# Patient Record
Sex: Male | Born: 1980 | Race: Black or African American | Hispanic: No | Marital: Single | Smoking: Former smoker
Health system: Southern US, Community
[De-identification: ages and names within clinical notes are randomized; demographics above are authoritative.]

## PROBLEM LIST (undated history)

## (undated) DIAGNOSIS — J45909 Unspecified asthma, uncomplicated: Secondary | ICD-10-CM

## (undated) HISTORY — PX: NO PAST SURGERIES: SHX2092

## (undated) HISTORY — PX: FOOT SURGERY: SHX648

---

## 2012-08-27 ENCOUNTER — Encounter (HOSPITAL_COMMUNITY): Payer: Self-pay | Admitting: *Deleted

## 2012-08-27 ENCOUNTER — Emergency Department (HOSPITAL_COMMUNITY)
Admission: EM | Admit: 2012-08-27 | Discharge: 2012-08-28 | Disposition: A | Discharge status: Home / Self Care | Payer: Self-pay | Attending: Emergency Medicine | Admitting: Emergency Medicine

## 2012-08-27 DIAGNOSIS — J45909 Unspecified asthma, uncomplicated: Secondary | ICD-10-CM | POA: Insufficient documentation

## 2012-08-27 DIAGNOSIS — H9209 Otalgia, unspecified ear: Secondary | ICD-10-CM | POA: Insufficient documentation

## 2012-08-27 DIAGNOSIS — F17200 Nicotine dependence, unspecified, uncomplicated: Secondary | ICD-10-CM | POA: Insufficient documentation

## 2012-08-27 DIAGNOSIS — H6120 Impacted cerumen, unspecified ear: Secondary | ICD-10-CM | POA: Insufficient documentation

## 2012-08-27 DIAGNOSIS — H9190 Unspecified hearing loss, unspecified ear: Secondary | ICD-10-CM | POA: Insufficient documentation

## 2012-08-27 DIAGNOSIS — H6093 Unspecified otitis externa, bilateral: Secondary | ICD-10-CM

## 2012-08-27 DIAGNOSIS — H6123 Impacted cerumen, bilateral: Secondary | ICD-10-CM

## 2012-08-27 DIAGNOSIS — H60399 Other infective otitis externa, unspecified ear: Secondary | ICD-10-CM | POA: Insufficient documentation

## 2012-08-27 HISTORY — DX: Unspecified asthma, uncomplicated: J45.909

## 2012-08-27 NOTE — ED Notes (Signed)
Left ear "feels clogged" has been sticking objects in ear to try and remove sensation.

## 2012-08-28 MED ORDER — DOCUSATE SODIUM 50 MG/5ML PO LIQD
50.0000 mg | Freq: Once | ORAL | Status: AC
  Administered 2012-08-28: 50 mg via OTIC
  Filled 2012-08-28: qty 10

## 2012-08-28 MED ORDER — OFLOXACIN 0.3 % OT SOLN: 3.0000 [drp] | Freq: Two times a day (BID) | OTIC | Status: AC

## 2012-08-28 NOTE — ED Provider Notes (Signed)
History    This chart was scribed for a non-physician practitioner working with Gerhard Munch, MD by Jiles Prows, ED scribe. This patient was seen in room WTR7/WTR7 and the patient's care was started at 12:12 AM.  CSN: [id number]  Arrival date & time 08/27/12  2209  Chief Complaint  Patient presents with  . Ear Fullness    left   Patient is a 32 y.o. male presenting with plugged ear sensation. The history is provided by the patient and medical records. No language interpreter was used.  Ear Fullness This is a new problem. The current episode started more than 1 week ago. The problem occurs constantly. The problem has been gradually worsening. Pertinent negatives include no chest pain, no abdominal pain, no headaches and no shortness of breath. Nothing aggravates the symptoms. Nothing relieves the symptoms.   HPI Comments: Corey Serrano is a 32 y.o. male who presents to the Emergency Department complaining of moderate, constant pain and humming in his left ear for about a month. He describes the feeling as "clogged." He reports that for the past 2 weeks, and states he has not been able to hear well out of this left ear.  Pt reports that he picks at his ears with a bobby pin on a regular basis.  Pt denies headache, diaphoresis, fever, chills, nausea, vomiting, diarrhea, weakness, cough, SOB and any other pain.   Past Medical History  Diagnosis Date  . Asthma     History reviewed. No pertinent past surgical history.  No family history on file.  History  Substance Use Topics  . Smoking status: Current Every Day Smoker  . Smokeless tobacco: Not on file  . Alcohol Use: No     Review of Systems  Constitutional: Negative for fever, diaphoresis, appetite change, fatigue and unexpected weight change.  HENT: Positive for hearing loss (left ear) and ear pain. Negative for mouth sores and neck stiffness.   Eyes: Negative for visual disturbance.  Respiratory: Negative for cough, chest  tightness, shortness of breath and wheezing.   Cardiovascular: Negative for chest pain.  Gastrointestinal: Negative for nausea, vomiting, abdominal pain, diarrhea and constipation.  Endocrine: Negative for polydipsia, polyphagia and polyuria.  Genitourinary: Negative for dysuria, urgency, frequency and hematuria.  Musculoskeletal: Negative for back pain.  Skin: Negative for rash.  Allergic/Immunologic: Negative for immunocompromised state.  Neurological: Negative for syncope, light-headedness and headaches.  Hematological: Does not bruise/bleed easily.  Psychiatric/Behavioral: Negative for sleep disturbance. The patient is not nervous/anxious.     Allergies  Penicillins and Shellfish allergy  Home Medications   Current Outpatient Rx  Name  Route  Sig  Dispense  Refill  . ofloxacin (FLOXIN) 0.3 % otic solution   Both Ears   Place 3 drops into both ears 2 (two) times daily.   5 mL   0     Triage Vitals: BP 130/83  Pulse 82  Temp(Src) 98 F (36.7 C) (Oral)  Resp 18  Wt 230 lb (104.327 kg)  SpO2 97%  Physical Exam  Nursing note and vitals reviewed. Constitutional: He is oriented to person, place, and time. He appears well-developed and well-nourished. No distress.  HENT:  Head: Normocephalic and atraumatic.  Right Ear: External ear normal.  Left Ear: External ear normal.  Mouth/Throat: Oropharynx is clear and moist. No oropharyngeal exudate.  Cerumen in right ear. Cerumen in left ear.  Eyes: Conjunctivae are normal. Pupils are equal, round, and reactive to light. No scleral icterus.  Neck: Normal range of  motion. Neck supple.  Cardiovascular: Normal rate, regular rhythm, normal heart sounds and intact distal pulses.   No murmur heard. Pulmonary/Chest: Effort normal and breath sounds normal. No respiratory distress. He has no wheezes.  Musculoskeletal: Normal range of motion. He exhibits no edema.  Lymphadenopathy:    He has no cervical adenopathy.  Neurological: He  is alert and oriented to person, place, and time. No cranial nerve deficit. He exhibits normal muscle tone. Coordination normal.  Speech is clear and goal oriented Moves extremities without ataxia  Skin: Skin is warm and dry. No rash noted. He is not diaphoretic. No erythema.  Psychiatric: He has a normal mood and affect. His behavior is normal.   ED Course  FOREIGN BODY REMOVAL Date/Time: 08/28/2012 12:31 AM Performed by: Dierdre Forth Authorized by: Dierdre Forth Consent: Verbal consent obtained. Risks and benefits: risks, benefits and alternatives were discussed Consent given by: patient Patient understanding: patient states understanding of the procedure being performed Patient consent: the patient's understanding of the procedure matches consent given Procedure consent: procedure consent matches procedure scheduled Relevant documents: relevant documents present and verified Site marked: the operative site was marked Required items: required blood products, implants, devices, and special equipment available Patient identity confirmed: arm band and verbally with patient Body area: ear (bilateral) Patient sedated: no Patient restrained: no Patient cooperative: yes Localization method: ENT speculum, probed and magnification Removal mechanism: curette, ear scoop, irrigation and suction Complexity: complex 2 objects recovered. Objects recovered: larege cerumen impactions bilaterally Post-procedure assessment: foreign body removed Patient tolerance: Patient tolerated the procedure well with no immediate complications.   (including critical care time) DIAGNOSTIC STUDIES: Oxygen Saturation is 97% on RA, adequate by my interpretation.    COORDINATION OF CARE: 1:17 AM - Discussed ED treatment with pt at bedside including ear wax removal and pt agrees.   Labs Reviewed - No data to display No results found.  1. Cerumen impaction, bilateral   2. Otitis externa,  bilateral      MDM  Corey Serrano presents with bilateral cerumen impaction and mild otitis externa from using a bobby pin to clean his years.  Earwax successfully removed from both ears. TMs nonbulging non-erythematous however both canals with erythema, mild edema. Exam consistent with mild otitis media. No canal occlusion, Pt afebrile in NAD. Exam non concerning for mastoiditis, cellulitis or malignant OE.  D/c with ofloxacin script.  Advised  follow up in 2-3 days if no improvement with treatment or no complete resolution by 7 days with ear nose and throat. I have also discussed reasons to return immediately to the ER.  Patient expresses understanding and agrees with plan.  I personally performed the services described in this documentation, which was scribed in my presence. The recorded information has been reviewed and is accurate.   Dahlia Client Milley Vining, PA-C 08/28/12 0120  Dahlia Client Jakeem Grape, PA-C 08/28/12 0131

## 2012-08-28 NOTE — ED Notes (Signed)
Irrigated bil ears, removed mod amt of particles.

## 2012-08-29 NOTE — ED Provider Notes (Signed)
  Medical screening examination/treatment/procedure(s) were performed by non-physician practitioner and as supervising physician I was immediately available for consultation/collaboration.    Gerhard Munch, MD 08/29/12 [phone]

## 2013-11-12 ENCOUNTER — Emergency Department (HOSPITAL_COMMUNITY)
Admission: EM | Admit: 2013-11-12 | Discharge: 2013-11-12 | Disposition: A | Discharge status: Home / Self Care | Payer: PRIVATE HEALTH INSURANCE | Attending: Emergency Medicine | Admitting: Emergency Medicine

## 2013-11-12 ENCOUNTER — Encounter (HOSPITAL_COMMUNITY): Payer: Self-pay | Admitting: Emergency Medicine

## 2013-11-12 DIAGNOSIS — Z880 Allergy status to penicillin: Secondary | ICD-10-CM | POA: Insufficient documentation

## 2013-11-12 DIAGNOSIS — F17200 Nicotine dependence, unspecified, uncomplicated: Secondary | ICD-10-CM | POA: Insufficient documentation

## 2013-11-12 DIAGNOSIS — K029 Dental caries, unspecified: Secondary | ICD-10-CM

## 2013-11-12 DIAGNOSIS — K089 Disorder of teeth and supporting structures, unspecified: Secondary | ICD-10-CM | POA: Diagnosis present

## 2013-11-12 DIAGNOSIS — J45909 Unspecified asthma, uncomplicated: Secondary | ICD-10-CM | POA: Diagnosis not present

## 2013-11-12 MED ORDER — METRONIDAZOLE 500 MG PO TABS: 500.0000 mg | ORAL_TABLET | Freq: Two times a day (BID) | ORAL | Status: DC

## 2013-11-12 MED ORDER — OXYCODONE-ACETAMINOPHEN 5-325 MG PO TABS: ORAL_TABLET | ORAL | Status: DC

## 2013-11-12 MED ORDER — OXYCODONE-ACETAMINOPHEN 5-325 MG PO TABS
1.0000 | ORAL_TABLET | Freq: Once | ORAL | Status: AC
  Administered 2013-11-12: 1 via ORAL
  Filled 2013-11-12: qty 1

## 2013-11-12 MED ORDER — METRONIDAZOLE 500 MG PO TABS
500.0000 mg | ORAL_TABLET | Freq: Once | ORAL | Status: AC
  Administered 2013-11-12: 500 mg via ORAL
  Filled 2013-11-12: qty 1

## 2013-11-12 NOTE — ED Notes (Signed)
Pt c/o right sided dental pain x 3 weeks

## 2013-11-12 NOTE — Discharge Instructions (Signed)
Take percocet for breakthrough pain, do not drink alcohol, drive, care for children or do other critical tasks while taking percocet.  Return to the emergency room for fever, change in vision, redness to the face that rapidly spreads towards the eye, nausea or vomiting, difficulty swallowing or shortness of breath.  Take your antibiotics as directed and to the end of the course. DO NOT drink alcohol when taking metronidazole, it will make you very sick!   Followup with a dentist is very important for ongoing evaluation and management of recurrent dental pain. Return to emergency department for emergent changing or worsening symptoms."  Low-cost dental clinic: [facility]  at [phone]**  **Nuala Alpha at [phone] [address]**    You may also call [phone]  Dental Assistance If the dentist on-call cannot see you, please use the resources below:   Patients with Medicaid: [facility] [phone] [address], [phone] [address] [phone]  If unable to pay, or uninsured, contact [facility] [phone]) or [facility] Department [phone] in [place], [phone] in [facility] to become qualified for the adult dental clinic  Other Low-Cost Community Dental Services: [facility]- [address] Natasha Bence [place], [place], [address]    [phone], Ext. [phone]    2nd and 4th Thursday of the month at 6:30am    10 clients each day by appointment, can sometimes see walk-in     patients if someone does not show for an appointment [facility]- [address] Ether Griffins [place], [place], [address]    [phone] [facility] [address], [place], [place], [address]    [phone]  [facility]- [phone] [facility] Department- [phone] [facility] Department- [phone]

## 2013-11-12 NOTE — ED Notes (Signed)
Declined W/C at D/C and was escorted to lobby by RN. 

## 2013-11-12 NOTE — ED Provider Notes (Signed)
CSN: [id number]     Arrival date & time 11/12/13  1657 History   First MD Initiated Contact with Patient 11/12/13 1759     Chief Complaint  Patient presents with  . Dental Pain     (Consider location/radiation/quality/duration/timing/severity/associated sxs/prior Treatment) HPI  Corey Serrano is a 33 y.o. male complaining of significant right-sided dental pain on the upper teeth worsening over the course of 3 weeks. Patient has been taking over-the-counter pain medication with little relief. Pain is severe and keeping him sleeping at night.Denies fever/chills, difficulty opening jaw, difficulty swallowing, SOB, gum swelling, facial swelling, neck swelling.   Past Medical History  Diagnosis Date  . Asthma    History reviewed. No pertinent past surgical history. History reviewed. No pertinent family history. History  Substance Use Topics  . Smoking status: Current Every Day Smoker  . Smokeless tobacco: Not on file  . Alcohol Use: No    Review of Systems  10 systems reviewed and found to be negative, except as noted in the HPI.   Allergies  Shellfish allergy and Penicillins  Home Medications   Prior to Admission medications   Medication Sig Start Date End Date Taking? Authorizing Provider  metroNIDAZOLE (FLAGYL) 500 MG tablet Take 1 tablet (500 mg total) by mouth 2 (two) times daily. One po bid x 7 days 11/12/13   Wynetta Emery, PA-C  oxyCODONE-acetaminophen (PERCOCET/ROXICET) 5-325 MG per tablet 1 to 2 tabs PO q6hrs  PRN for pain 11/12/13   Joni Reining Samier Jaco, PA-C   BP 125/79  Pulse 72  Temp(Src) 98.1 F (36.7 C) (Oral)  Resp 14  SpO2 100% Physical Exam  Nursing note and vitals reviewed. Constitutional: He is oriented to person, place, and time. He appears well-developed and well-nourished. No distress.  HENT:  Head: Normocephalic and atraumatic.  Mouth/Throat: Oropharynx is clear and moist.    Eyes: Conjunctivae and EOM are normal. Pupils are equal, round, and  reactive to light.  Neck: Normal range of motion.  Cardiovascular: Normal rate, regular rhythm and intact distal pulses.   Pulmonary/Chest: Effort normal and breath sounds normal. No stridor.  Abdominal: Soft.  Musculoskeletal: Normal range of motion.  Neurological: He is alert and oriented to person, place, and time.  Psychiatric: He has a normal mood and affect.    ED Course  Procedures (including critical care time) Labs Review Labs Reviewed - No data to display  Imaging Review No results found.   EKG Interpretation None      MDM   Final diagnoses:  Pain due to dental caries    Filed Vitals:   11/12/13 1710 11/12/13 1818  BP: 132/79 125/79  Pulse: 87 72  Temp: 98.3 F (36.8 C) 98.1 F (36.7 C)  TempSrc: Oral Oral  Resp: 18 14  SpO2: 99% 100%    Medications  oxyCODONE-acetaminophen (PERCOCET/ROXICET) 5-325 MG per tablet 1 tablet (1 tablet Oral Given 11/12/13 1820)  metroNIDAZOLE (FLAGYL) tablet 500 mg (500 mg Oral Given 11/12/13 1820)   Corey Serrano is a 33 y.o. male complaining of dental pain associated with dental caries but no signs or symptoms of dental abscess. Patient afebrile, non toxic appearing and swallowing secretions well. I gave patient referral to dentist and stressed the importance of dental follow up for definitive management of dental issues. Patient voices understanding and is agreeable to plan.  Evaluation does not show pathology that would require ongoing emergent intervention or inpatient treatment. Pt is hemodynamically stable and mentating appropriately. Discussed findings and plan with patient/guardian,  who agrees with care plan. All questions answered. Return precautions discussed and outpatient follow up given.   Discharge Medication List as of 11/12/2013  6:11 PM    START taking these medications   Details  metroNIDAZOLE (FLAGYL) 500 MG tablet Take 1 tablet (500 mg total) by mouth 2 (two) times daily. One po bid x 7 days, Starting  11/12/2013, Until Discontinued, Print    oxyCODONE-acetaminophen (PERCOCET/ROXICET) 5-325 MG per tablet 1 to 2 tabs PO q6hrs  PRN for pain, Print             Wynetta Emery, PA-C 11/12/13 2353

## 2013-11-13 NOTE — ED Provider Notes (Signed)
Medical screening examination/treatment/procedure(s) were performed by non-physician practitioner and as supervising physician I was immediately available for consultation/collaboration.  Flint Melter, MD 11/13/13 [phone]

## 2013-11-29 ENCOUNTER — Emergency Department (HOSPITAL_COMMUNITY)
Admission: EM | Admit: 2013-11-29 | Discharge: 2013-11-29 | Disposition: A | Discharge status: Home / Self Care | Payer: PRIVATE HEALTH INSURANCE | Attending: Emergency Medicine | Admitting: Emergency Medicine

## 2013-11-29 ENCOUNTER — Encounter (HOSPITAL_COMMUNITY): Payer: Self-pay | Admitting: Emergency Medicine

## 2013-11-29 DIAGNOSIS — Z792 Long term (current) use of antibiotics: Secondary | ICD-10-CM | POA: Diagnosis not present

## 2013-11-29 DIAGNOSIS — K089 Disorder of teeth and supporting structures, unspecified: Secondary | ICD-10-CM | POA: Diagnosis present

## 2013-11-29 DIAGNOSIS — Z79899 Other long term (current) drug therapy: Secondary | ICD-10-CM | POA: Diagnosis not present

## 2013-11-29 DIAGNOSIS — K029 Dental caries, unspecified: Secondary | ICD-10-CM | POA: Diagnosis not present

## 2013-11-29 DIAGNOSIS — F17200 Nicotine dependence, unspecified, uncomplicated: Secondary | ICD-10-CM | POA: Insufficient documentation

## 2013-11-29 DIAGNOSIS — K0889 Other specified disorders of teeth and supporting structures: Secondary | ICD-10-CM

## 2013-11-29 DIAGNOSIS — J45909 Unspecified asthma, uncomplicated: Secondary | ICD-10-CM | POA: Diagnosis not present

## 2013-11-29 DIAGNOSIS — Z880 Allergy status to penicillin: Secondary | ICD-10-CM | POA: Insufficient documentation

## 2013-11-29 MED ORDER — CLINDAMYCIN HCL 150 MG PO CAPS: 300.0000 mg | ORAL_CAPSULE | Freq: Three times a day (TID) | ORAL | Status: DC

## 2013-11-29 MED ORDER — HYDROCODONE-ACETAMINOPHEN 5-325 MG PO TABS
2.0000 | ORAL_TABLET | Freq: Once | ORAL | Status: AC
  Administered 2013-11-29: 2 via ORAL
  Filled 2013-11-29: qty 2

## 2013-11-29 MED ORDER — OXYCODONE-ACETAMINOPHEN 5-325 MG PO TABS: ORAL_TABLET | ORAL | Status: DC

## 2013-11-29 NOTE — Discharge Instructions (Signed)
Dental Pain °A tooth ache may be caused by cavities (tooth decay). Cavities expose the nerve of the tooth to air and hot or cold temperatures. It may come from an infection or abscess (also called a boil or furuncle) around your tooth. It is also often caused by dental caries (tooth decay). This causes the pain you are having. °DIAGNOSIS  °Your caregiver can diagnose this problem by exam. °TREATMENT  °· If caused by an infection, it may be treated with medications which kill germs (antibiotics) and pain medications as prescribed by your caregiver. Take medications as directed. °· Only take over-the-counter or prescription medicines for pain, discomfort, or fever as directed by your caregiver. °· Whether the tooth ache today is caused by infection or dental disease, you should see your dentist as soon as possible for further care. °SEEK MEDICAL CARE IF: °The exam and treatment you received today has been provided on an emergency basis only. This is not a substitute for complete medical or dental care. If your problem worsens or new problems (symptoms) appear, and you are unable to meet with your dentist, call or return to this location. °SEEK IMMEDIATE MEDICAL CARE IF:  °· You have a fever. °· You develop redness and swelling of your face, jaw, or neck. °· You are unable to open your mouth. °· You have severe pain uncontrolled by pain medicine. °MAKE SURE YOU:  °· Understand these instructions. °· Will watch your condition. °· Will get help right away if you are not doing well or get worse. °Document Released: 03/08/2005 Document Revised: 05/31/2011 Document Reviewed: 10/25/2007 °ExitCare® Patient Information ©2015 [employer]. This information is not intended to replace advice given to you by your health care provider. Make sure you discuss any questions you have with your health care provider. ° °Emergency Department Resource Guide °1) Find a Doctor and Pay Out of Pocket °Although you won't have to find out who  is covered by your insurance plan, it is a good idea to ask around and get recommendations. You will then need to call the office and see if the doctor you have chosen will accept you as a new patient and what types of options they offer for patients who are self-pay. Some doctors offer discounts or will set up payment plans for their patients who do not have insurance, but you will need to ask so you aren't surprised when you get to your appointment. ° °2) Contact Your Local Health Department °Not all health departments have doctors that can see patients for sick visits, but many do, so it is worth a call to see if yours does. If you don't know where your local health department is, you can check in your phone book. The CDC also has a tool to help you locate your state's health department, and many state websites also have listings of all of their local health departments. ° °3) Find a Walk-in Clinic °If your illness is not likely to be very severe or complicated, you may want to try a walk in clinic. These are popping up all over the country in pharmacies, drugstores, and shopping centers. They're usually staffed by nurse practitioners or physician assistants that have been trained to treat common illnesses and complaints. They're usually fairly quick and inexpensive. However, if you have serious medical issues or chronic medical problems, these are probably not your best option. ° °No Primary Care Doctor: °- Call [facility] at  832-8000 - they can help you locate a primary   care doctor that  accepts your insurance, provides certain services, etc. °- Physician Referral Service- 1-800-533-3463 ° °Chronic Pain Problems: °Organization         Address  Phone   Notes  ° [facility]  (336) 297-2271 Patients need to be referred by their primary care doctor.  ° °Medication Assistance: °Organization         Address  Phone   Notes  °[place] [facility] [address]  [address] °[place], [place] [address] (336) 641-8030 --Must be a resident of [place] °-- Must have NO insurance coverage whatsoever (no Medicaid/ Medicare, etc.) °-- The pt. MUST have a primary care doctor that directs their care regularly and follows them in the community °  °[facility]  (866) 331-1348   °[employer]  (888) 892-1162   ° °Agencies that provide inexpensive medical care: °Organization         Address  Phone   Notes  °[facility]  (336) 832-8035   °[facility]    (336) 832-7272   °[facility] [address] °[place], [place] [address] (336) 832-4777   °[facility] [address], °[place] (336) 271-4999   °[facility]    (336) 373-0678   °[facility]    (336) 272-1050   °[facility] ° [address], [place] Phone:  (336) 832-4444, Fax:  (336) 832-4440 Hours of Operation:  9 am - 6 pm, M-F.  Also accepts Medicaid/Medicare and self-pay.  °[facility] ° [address], [place] Phone: (336) 832-3150, Fax: (336) 832-3151. Hours of Operation:  8:30 am - 5:30 pm, M-F.  Also accepts Medicaid and self-pay.  °[facility] [place] [address], [place] Phone: (336) 878-6027   °[facility] [address], [place], [place] (336)723-1848, Ext. 123 Mondays & Thursdays: 7-9 AM.  First 15 patients are seen on a first come, first serve basis. °  ° °Medicaid-accepting [place] Providers: ° °Organization         Address  Phone   Notes  °[facility] [address], [place] (336) 641-2100 Also accepts self-pay patients.  °[facility] [address], [place] ° (336) 856-9996   °[facility] [address], [place] (336) 288-8857   °[facility] [address] [place] [address], [place] (336) 299-7000   °Veita Bland [address], [place]  ° (336) 373-1557 Only accepts [place] Access Medicaid patients after they have their name applied to their card.  ° °Self-Pay (no insurance) in [place]: ° °Organization         Address  Phone   Notes  °Sickle Cell Patients, [facility] [address], [place] (336) 832-1970   °[facility] [address], [place] (336) 832-4400   °[facility] ° [address], [place] (336) 992-4800   °[facility]/Dr. Osei-Bonsu ° [address] [place] [address], [place] or [address], [place] (336) 841-8500 Phone number for both [place] and [place] locations is the same.  °[facility] [address], [place] (336) 299-0000   °[facility] [address] [place] [address],  or [address] (336) 852-7530 °(336) 878-2260   °  [facility] [address], [place] (336) 350-1642, phone; (336) 294-5005, fax Sees patients 1st and 3rd Saturday of every month.  Must not qualify for public or private insurance (i.e. Medicaid, Medicare, [place] Health Choice, Veterans' Benefits) • Household income should be no more than 200% of the poverty level •The clinic cannot treat you if you are pregnant or think you are pregnant • Sexually transmitted diseases are not treated at the clinic.  ° ° °Dental Care: °Organization         Address  Phone  Notes  °[facility] [address], [place] (336) 641-6152 Accepts children up to age 21 who are enrolled in Medicaid or [place] Health Choice; pregnant women with a Medicaid card; and children who have applied for Medicaid or [place] Health Choice, but were declined, whose parents can pay a reduced fee at time of service.  °[facility] [place]  [address], [place] (336) 641-7733 Accepts children up to age 21 who are  enrolled in Medicaid or [place] Health Choice; pregnant women with a Medicaid card; and children who have applied for Medicaid or [place] Health Choice, but were declined, whose parents can pay a reduced fee at time of service.  °[facility] ° [address], [place] (336) 641-4533 Patients are seen by appointment only. Walk-ins are not accepted. [facility] will see patients 18 years of age and older. °Monday - Tuesday (8am-5pm) °Most Wednesdays (8:30-5pm) °$30 per visit, cash only  °[facility] ° [address], [place] (336) 641-4533 Patients are seen by appointment only. Walk-ins are not accepted. [facility] will see patients 18 years of age and older. °One Wednesday Evening (Monthly: Volunteer Based).  $30 per visit, cash only  °[facility]  (919) 537-3737 for adults; Children under age 4, call Graduate Pediatric Dentistry at (919) [phone]. Children aged 4-14, please call (919) [phone] to request a pediatric application. ° Dental services are provided in all areas of dental care including fillings, crowns and bridges, complete and partial dentures, implants, gum treatment, root canals, and extractions. Preventive care is also provided. Treatment is provided to both adults and children. °Patients are selected via a lottery and there is often a waiting list. °  °[facility] [address], °[place] ° (336) 763-8833 [email or web address] °  °[facility] [address], [place], [place] (336)723-1848, Ext. 123 Second and Fourth Thursday of each month, opens at 6:30 AM; Clinic ends at 9 AM.  Patients are seen on a first-come first-served basis, and a limited number are seen during each clinic.  ° °[facility] ° [address], [place], [place] (336) 723-7904   Eligibility Requirements °You must have lived in [place], [place], or [place] counties for at least the last three months. °  You  cannot be eligible for state or federal sponsored healthcare insurance, including Veterans Administration, Medicaid, or Medicare. °  You generally cannot be eligible for healthcare insurance through your employer.  °  How to apply: °Eligibility screenings are held every Tuesday and Wednesday afternoon from 1:00 pm until 4:00 pm. You do not need an appointment for the interview!  °[facility] [address], [place], [place] 336-631-2330   °[facility] [facility]  336-342-8273   °[facility]  336-703-3100   °[facility]  336-570-6415   ° °Behavioral Health Resources in the Community: °Intensive Outpatient Programs °Organization         Address  Phone  Notes  °[facility] [address], [place], [place] 336-878-6098   °[facility] [address], [place], [place] 336-832-9800   °[facility] [address], [place], [place] ° 336-882-2125   °[facility] [address],  °[place], [place] 1-800-853-5163 or 336-641-4981   °Substance Abuse Resources °Organization         Address  Phone  Notes  °[facility]  336-882-2125   °[facility]  336-784-9470   °[facility]  336-285-9073   °[facility]  336-845-3988   °Residential & Outpatient Substance Abuse Program  1-800-659-3381   °Psychological Services °Organization         Address  Phone  Notes  °[facility]  336- 832-9600   °[facility]  336- 378-7881   °[facility] [address], [place] 1-800-853-5163 or 336-641-4981   ° °Mobile Crisis Teams °Organization         Address  Phone  Notes  °[facility], Mobile Crisis Care Unit  1-877-626-1772   °Assertive °[facility] ° [address] [place], [place] 336-834-9664   °Sharon DeEsch [address] °[place] 336-554-5454   ° °Self-Help/Support  Groups °Organization         Address  Phone             Notes  °[facility] - variety of support groups  336- 373-1402 Call for more information  °Narcotics Anonymous (NA), [facility] [address], °[place]  2 meetings at this location  ° °Residential Treatment Programs °Organization         Address  Phone  Notes  °[facility] [address],    °[place]  1-866-801-8205   °[facility] ° [address], [place], [place] 704-293-8524   °[facility] [address], [place] 336-845-3988 Admissions: 8am-3pm M-F  °[facility] [address]    °[place], [place] 336-841-1104   °[facility] [address], [place], [place] 336-379-7146   °[facility] [address]  °[place], [place] 336-285-9073   °[facility] Intensive Outpatient [address], [place], [place] 336-852-3033   °[facility] [address]  °[place], [place] 1-877-615-2722 or 336-784-9470   °[facility] [address] [place], [place] 336-227-7417 Accepts Medicaid  °[facility] [address]  °[place] 1-800-659-3381 Substance Abuse/Addiction Treatment  ° °[place] Behavioral Health Resources °Organization         Address  Phone  Notes  °[facility]  (888) 581-9988   °Julie Brannon, PhD [address] [place], [place]   (336) 349-5553 or (336) 951-0000   °[facility]   [address] °[place], [place] (336) 349-4454   °[facility] [address], [place], [place] (336) 342-8316 Insurance/Medicaid/sponsorship through [facility]  °[facility] [address]                                    [place], [place] (336) 342-8316 Therapy/tele-psych/case  °[facility]   [address]  ° [place], [place] (336) 349-2233    °Dr. Arfeen  (336) 349-4544   °[facility] [place]  [facility]  [employer] [facility] [place] [facility] 1) [address], [place] °2) [address], [place] °3)  [address], [place] (336) 349-3220 °(336) 342-7768 ° °(336) 342-8140   °[place] [facility] (336) 342-1394 or (336) 342-3537 (After Hours)    ° ° ° °

## 2013-11-29 NOTE — ED Notes (Addendum)
Pt reports R upper toothache.  States his wisdom tooth on that side is what's bothering him.

## 2013-11-29 NOTE — ED Provider Notes (Signed)
CSN: [id number]     Arrival date & time 11/29/13  2017 History  This chart was scribed for Antony Madura, PA-C working with Arby Barrette, MD by Evon Slack, ED Scribe. This patient was seen in room WTR7/WTR7 and the patient's care was started at 9:54 PM.    Chief Complaint  Patient presents with  . Dental Pain   Patient is a 33 y.o. male presenting with tooth pain. The history is provided by the patient. No language interpreter was used.  Dental Pain Associated symptoms: facial swelling    HPI Comments: Corey Serrano is a 33 y.o. male who presents to the Emergency Department complaining of throbbing and sharp dental pain onset 2 weeks prior. He states he was having some associated facial pain and facial swelling. He states that cold drink make the pain worse. He states he has been taking tylenol with no relief. He states that he thinks that he think his tooth is infected. He states he was in [facility] ED and received pain medication and antibiotics. He states he was not able to go to the dentist due to complications. He denies any trauma to the tooth, oral bleeding or lesions, difficulty swallowing, inability to open his jaw, and SOB.   Past Medical History  Diagnosis Date  . Asthma    History reviewed. No pertinent past surgical history. History reviewed. No pertinent family history. History  Substance Use Topics  . Smoking status: Current Every Day Smoker  . Smokeless tobacco: Not on file  . Alcohol Use: No    Review of Systems  HENT: Positive for dental problem and facial swelling.   All other systems reviewed and are negative.   Allergies  Shellfish allergy and Penicillins  Home Medications   Prior to Admission medications   Medication Sig Start Date End Date Taking? Authorizing Provider  clindamycin (CLEOCIN) 150 MG capsule Take 2 capsules (300 mg total) by mouth 3 (three) times daily. May dispense as  capsules 11/29/13   Antony Madura, PA-C  metroNIDAZOLE (FLAGYL)  500 MG tablet Take 1 tablet (500 mg total) by mouth 2 (two) times daily. One po bid x 7 days 11/12/13   [facility], PA-C  oxyCODONE-acetaminophen (PERCOCET/ROXICET) 5-325 MG per tablet 1 to 2 tabs PO q6hrs  PRN for pain 11/29/13   Antony Madura, PA-C   Triage Vitals: BP 124/85  Pulse 66  Temp(Src) 98.7 F (37.1 C) (Oral)  Resp 20  SpO2 100%  Physical Exam  Nursing note and vitals reviewed. Constitutional: He is oriented to person, place, and time. He appears well-developed and well-nourished. No distress.  Nontoxic/nonseptic appearing  HENT:  Head: Normocephalic and atraumatic.  Mouth/Throat: Uvula is midline, oropharynx is clear and moist and mucous membranes are normal. No oral lesions. No trismus in the jaw. Abnormal dentition. Dental caries present. No dental abscesses or uvula swelling. No oropharyngeal exudate.    Oropharynx clear. No gingival fluctuance or swelling. Patient tolerating secretions without difficulty. No trismus.  Eyes: Conjunctivae and EOM are normal. No scleral icterus.  Neck: Normal range of motion. Neck supple.  Pulmonary/Chest: Effort normal. No respiratory distress. He has no wheezes.  Chest expansion symmetric  Musculoskeletal: Normal range of motion.  Neurological: He is alert and oriented to person, place, and time. He exhibits normal muscle tone. Coordination normal.  Skin: Skin is warm and dry. No rash noted. He is not diaphoretic. No erythema. No pallor.  Psychiatric: He has a normal mood and affect. His behavior is normal.  ED Course  Procedures (including critical care time) DIAGNOSTIC STUDIES: Oxygen Saturation is 100% on RA, normal by my interpretation.    COORDINATION OF CARE:  Labs Review Labs Reviewed - No data to display  Imaging Review No results found.   EKG Interpretation None      MDM   Final diagnoses:  Dentalgia    Patient with toothache, recurrent. No gross abscess. Exam unconcerning for Ludwig's angina or  spread of infection. Will treat with clindamycin and pain medicine. Urged patient to follow-up with dentist. Referral and resource guide provided. Patient agreeable to plan with no unaddressed concerns.  I personally performed the services described in this documentation, which was scribed in my presence. The recorded information has been reviewed and is accurate.   Filed Vitals:   11/29/13 2042  BP: 124/85  Pulse: 66  Temp: 98.7 F (37.1 C)  TempSrc: Oral  Resp: 20  SpO2: 100%        Antony Madura, PA-C 11/29/13 2245

## 2013-11-29 NOTE — ED Notes (Signed)
Pt was seen on the 24th at [facility] and is still taking the antibiotics but hes out of pain medication

## 2013-11-29 NOTE — ED Provider Notes (Signed)
Medical screening examination/treatment/procedure(s) were performed by non-physician practitioner and as supervising physician I was immediately available for consultation/collaboration.   EKG Interpretation None       Arby Barrette, MD 11/29/13 [phone]

## 2014-01-11 ENCOUNTER — Emergency Department (HOSPITAL_COMMUNITY)
Admission: EM | Admit: 2014-01-11 | Discharge: 2014-01-11 | Disposition: A | Discharge status: Home / Self Care | Payer: PRIVATE HEALTH INSURANCE | Attending: Emergency Medicine | Admitting: Emergency Medicine

## 2014-01-11 ENCOUNTER — Encounter (HOSPITAL_COMMUNITY): Payer: Self-pay | Admitting: Emergency Medicine

## 2014-01-11 DIAGNOSIS — Y9389 Activity, other specified: Secondary | ICD-10-CM | POA: Diagnosis not present

## 2014-01-11 DIAGNOSIS — Y9289 Other specified places as the place of occurrence of the external cause: Secondary | ICD-10-CM | POA: Diagnosis not present

## 2014-01-11 DIAGNOSIS — Z23 Encounter for immunization: Secondary | ICD-10-CM | POA: Insufficient documentation

## 2014-01-11 DIAGNOSIS — Z792 Long term (current) use of antibiotics: Secondary | ICD-10-CM | POA: Insufficient documentation

## 2014-01-11 DIAGNOSIS — W450XXA Nail entering through skin, initial encounter: Secondary | ICD-10-CM | POA: Diagnosis not present

## 2014-01-11 DIAGNOSIS — Y990 Civilian activity done for income or pay: Secondary | ICD-10-CM | POA: Diagnosis not present

## 2014-01-11 DIAGNOSIS — Z720 Tobacco use: Secondary | ICD-10-CM | POA: Insufficient documentation

## 2014-01-11 DIAGNOSIS — S91332A Puncture wound without foreign body, left foot, initial encounter: Secondary | ICD-10-CM | POA: Diagnosis present

## 2014-01-11 DIAGNOSIS — Z880 Allergy status to penicillin: Secondary | ICD-10-CM | POA: Diagnosis not present

## 2014-01-11 DIAGNOSIS — J45909 Unspecified asthma, uncomplicated: Secondary | ICD-10-CM | POA: Insufficient documentation

## 2014-01-11 MED ORDER — CEPHALEXIN 500 MG PO CAPS: 500.0000 mg | ORAL_CAPSULE | Freq: Four times a day (QID) | ORAL | Status: DC

## 2014-01-11 MED ORDER — IBUPROFEN 600 MG PO TABS: 600.0000 mg | ORAL_TABLET | Freq: Four times a day (QID) | ORAL | Status: DC | PRN

## 2014-01-11 MED ORDER — CEPHALEXIN 500 MG PO CAPS
500.0000 mg | ORAL_CAPSULE | Freq: Once | ORAL | Status: AC
  Administered 2014-01-11: 500 mg via ORAL
  Filled 2014-01-11: qty 1

## 2014-01-11 MED ORDER — TETANUS-DIPHTH-ACELL PERTUSSIS 5-2.5-18.5 LF-MCG/0.5 IM SUSP
0.5000 mL | Freq: Once | INTRAMUSCULAR | Status: AC
Start: 2014-01-11 — End: 2014-01-11
  Administered 2014-01-11: 0.5 mL via INTRAMUSCULAR
  Filled 2014-01-11: qty 0.5

## 2014-01-11 NOTE — ED Notes (Signed)
Pt arrived to the ED with a complaint of a puncture wound to the left heel from a rusty nail.  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

## 2014-01-11 NOTE — ED Provider Notes (Signed)
CSN: [id number]     Arrival date & time 01/11/14  2050 History   First MD Initiated Contact with Patient 01/11/14 2131     This chart was scribed for non-physician practitioner working with Audree CamelScott T Goldston, MD by Arlan OrganAshley Leger, ED Scribe. This patient was seen in room WTR2/WLPT2 and the patient's care was started at 9:34 PM.  Chief Complaint  Patient presents with  . Puncture Wound   The history is provided by the patient. No language interpreter was used.    HPI Comments: Corey Serrano is a 33 y.o. male with a PMHx of Asthma who presents to the Emergency Department here for a puncture wound to the bottom of the L foot sustained just prior to arrival. Pt states he stepped off of his [employer] machine while at work and felt a sharp pain. When pt looked back he realized he stepped on a rusty nail. After incident he applied alcohol to the wound and dressed with a band aid. No redness or swelling to the area. No loss of sensation or numbness. Pt is unaware of last Tetanus shot. Pt with known allergy to Penicillins; denies anaphylaxis as a side effect. No other concerns this visit.  Past Medical History  Diagnosis Date  . Asthma    History reviewed. No pertinent past surgical history. History reviewed. No pertinent family history. History  Substance Use Topics  . Smoking status: Current Every Day Smoker  . Smokeless tobacco: Not on file  . Alcohol Use: No    Review of Systems  Skin: Positive for wound. Negative for color change.  Neurological: Negative for numbness.  All other systems reviewed and are negative.   Allergies  Shellfish allergy and Penicillins  Home Medications   Prior to Admission medications   Medication Sig Start Date End Date Taking? Authorizing Provider  clindamycin (CLEOCIN) 150 MG capsule Take 2 capsules (300 mg total) by mouth 3 (three) times daily. May dispense as 150mg  capsules 11/29/13   Antony MaduraKelly Jasnoor Trussell, PA-C  metroNIDAZOLE (FLAGYL) 500 MG tablet Take 1 tablet (500  mg total) by mouth 2 (two) times daily. One po bid x 7 days 11/12/13   [facility] Center[facility]ole Pisciotta, PA-C  oxyCODONE-acetaminophen (PERCOCET/ROXICET) 5-325 MG per tablet 1 to 2 tabs PO q6hrs  PRN for pain 11/29/13   Antony MaduraKelly Rebekkah Powless, PA-C   Triage Vitals: BP 125/77  Pulse 80  Temp(Src) 98.4 F (36.9 C) (Oral)  Resp 20  SpO2 99%   Physical Exam  Nursing note and vitals reviewed. Constitutional: He is oriented to person, place, and time. He appears well-developed and well-nourished. No distress.  Nontoxic/nonseptic appearing  HENT:  Head: Normocephalic and atraumatic.  Eyes: Conjunctivae and EOM are normal. No scleral icterus.  Neck: Normal range of motion. Neck supple.  Cardiovascular: Normal rate, regular rhythm and intact distal pulses.   DP and PT pulses 2+ in left lower extremity  Pulmonary/Chest: Effort normal. No respiratory distress.  Musculoskeletal: Normal range of motion. He exhibits tenderness.       Left foot: He exhibits tenderness. He exhibits normal range of motion, no swelling, normal capillary refill, no crepitus and no deformity.       Feet:  Neurological: He is alert and oriented to person, place, and time. He exhibits normal muscle tone. Coordination normal.  Sensation to light touch intact. Patient ambulatory with normal gait. Patient able to wiggle all toes of left foot.  Skin: Skin is warm and dry. No rash noted. He is not diaphoretic. No erythema.  No pallor.  Punctate puncture wound to the plantar medial aspect of the left midfoot   Psychiatric: He has a normal mood and affect. His behavior is normal.    ED Course  Procedures (including critical care time)  DIAGNOSTIC STUDIES: Oxygen Saturation is 99% on RA, Normal by my interpretation.    COORDINATION OF CARE: 9:31 PM- Will start on course of antibiotic. Will give Boostrix. Discussed treatment plan with pt at bedside and pt agreed to plan.     Labs Review Labs Reviewed - No data to display  Imaging Review No results  found.   EKG Interpretation None      MDM   Final diagnoses:  Puncture wound of foot, left, initial encounter    33 year old male presents to the emergency department for further evaluation of puncture wound to his left foot. Patient neurovascularly intact. No sensory deficits appreciated. Patient ambulatory with normal gait. Puncture site appreciated without foreign bodies. There is mild tenderness to palpation at puncture site. No evidence of secondary infection. No indication for emergent imaging. Tetanus updated in ED. Patient will be discharged on course of Keflex given dirty nature of wound. Wound care provided in ED prior to discharge. Return precautions discussed and provided. Patient agreeable to plan with no unaddressed concerns.  I personally performed the services described in this documentation, which was scribed in my presence. The recorded information has been reviewed and is accurate.    Filed Vitals:   01/11/14 2106  BP: 125/77  Pulse: 80  Temp: 98.4 F (36.9 C)  TempSrc: Oral  Resp: 20  SpO2: 99%     Antony MaduraKelly Korie Brabson, PA-C 01/11/14 2342

## 2014-01-11 NOTE — Discharge Instructions (Signed)
Puncture Wound °A puncture wound is an injury that extends through all layers of the skin and into the tissue beneath the skin (subcutaneous tissue). Puncture wounds become infected easily because germs often enter the body and go beneath the skin during the injury. Having a deep wound with a small entrance point makes it difficult for your caregiver to adequately clean the wound. This is especially true if you have stepped on a nail and it has passed through a dirty shoe or other situations where the wound is obviously contaminated. °CAUSES  °Many puncture wounds involve glass, nails, splinters, fish hooks, or other objects that enter the skin (foreign bodies). A puncture wound may also be caused by a human bite or animal bite. °DIAGNOSIS  °A puncture wound is usually diagnosed by your history and a physical exam. You may need to have an X-ray or an ultrasound to check for any foreign bodies still in the wound. °TREATMENT  °· Your caregiver will clean the wound as thoroughly as possible. Depending on the location of the wound, a bandage (dressing) may be applied. °· Your caregiver might prescribe antibiotic medicines. °· You may need a follow-up visit to check on your wound. Follow all instructions as directed by your caregiver. °HOME CARE INSTRUCTIONS  °· Change your dressing once per day, or as directed by your caregiver. If the dressing sticks, it may be removed by soaking the area in water. °· If your caregiver has given you follow-up instructions, it is very important that you return for a follow-up appointment. Not following up as directed could result in a chronic or permanent injury, pain, and disability. °· Only take over-the-counter or prescription medicines for pain, discomfort, or fever as directed by your caregiver. °· If you are given antibiotics, take them as directed. Finish them even if you start to feel better. °You may need a tetanus shot if: °· You cannot remember when you had your last tetanus  shot. °· You have never had a tetanus shot. °If you got a tetanus shot, your arm may swell, get red, and feel warm to the touch. This is common and not a problem. If you need a tetanus shot and you choose not to have one, there is a rare chance of getting tetanus. Sickness from tetanus can be serious. °You may need a rabies shot if an animal bite caused your puncture wound. °SEEK MEDICAL CARE IF:  °· You have redness, swelling, or increasing pain in the wound. °· You have red streaks going away from the wound. °· You notice a bad smell coming from the wound or dressing. °· You have yellowish-white fluid (pus) coming from the wound. °· You are treated with an antibiotic for infection, but the infection is not getting better. °· You notice something in the wound, such as rubber from your shoe, cloth, or another object. °· You have a fever. °· You have severe pain. °· You have difficulty breathing. °· You feel dizzy or faint. °· You cannot stop vomiting. °· You lose feeling, develop numbness, or cannot move a limb below the wound. °· Your symptoms worsen. °MAKE SURE YOU: °· Understand these instructions. °· Will watch your condition. °· Will get help right away if you are not doing well or get worse. °Document Released: 12/16/2004 Document Revised: 05/31/2011 Document Reviewed: 08/25/2010 °ExitCare® Patient Information ©2015 [employer]. This information is not intended to replace advice given to you by your health care provider. Make sure you discuss any questions you   have with your health care provider. ° °

## 2014-01-13 NOTE — ED Provider Notes (Signed)
Medical screening examination/treatment/procedure(s) were performed by non-physician practitioner and as supervising physician I was immediately available for consultation/collaboration.   EKG Interpretation None        Staria Birkhead T Jimesha Rising, MD 01/13/14 2334 

## 2014-07-23 ENCOUNTER — Ambulatory Visit (INDEPENDENT_AMBULATORY_CARE_PROVIDER_SITE_OTHER): Payer: Worker's Compensation | Admitting: Physician Assistant

## 2014-07-23 VITALS — BP 114/70 | HR 70 | Temp 98.0°F | Resp 16 | Ht 71.0 in | Wt 220.0 lb

## 2014-07-23 DIAGNOSIS — M79642 Pain in left hand: Secondary | ICD-10-CM | POA: Diagnosis not present

## 2014-07-23 DIAGNOSIS — S61219D Laceration without foreign body of unspecified finger without damage to nail, subsequent encounter: Secondary | ICD-10-CM

## 2014-07-23 NOTE — Progress Notes (Signed)
  Medical screening examination/treatment/procedure(s) were performed by non-physician practitioner and as supervising physician I was immediately available for consultation/collaboration.     

## 2014-07-23 NOTE — Patient Instructions (Signed)
Ibuprofen 600 mg three times a day. Apply ice when able. Work on Public relations account executivemoving your fingers. Return in 1 week for follow up.

## 2014-07-23 NOTE — Progress Notes (Signed)
   Subjective:    Patient ID: Corey Serrano, male    DOB: 05-19-1980, 34 y.o.   MRN: [id number]  HPI  This is a 34 year old male who is presenting for suture removal of a laceration of his left 2nd digit that happened at work 8 days ago. He was seen [address] 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 He still has a few more pills of Keflex take. He is noting it is difficult to flex his left second digit. He is having mild pain locally. He denies fever or, chills, abdominal pain, N/V/D.  Review of Systems  Constitutional: Negative for fever and chills.  Gastrointestinal: Negative for nausea, vomiting and abdominal pain.  Musculoskeletal: Positive for joint swelling.  Skin: Positive for wound. Negative for color change.  Neurological: Negative for weakness and numbness.  Hematological: Negative for adenopathy.    There are no active problems to display for this patient.  Prior to Admission medications   Medication Sig Start Date End Date Taking? Authorizing Provider  cephALEXin (KEFLEX) 500 MG capsule Take 1 capsule (500 mg total) by mouth 4 (four) times daily. 01/11/14  Yes Antony MaduraKelly Humes, PA-C  ibuprofen (ADVIL,MOTRIN) 600 MG tablet Take 1 tablet (600 mg total) by mouth every 6 (six) hours as needed. 01/11/14  Yes Antony MaduraKelly Humes, PA-C  Multiple Vitamins-Minerals (MULTIVITAMIN & MINERAL PO) Take 1 tablet by mouth daily.   Yes Historical Provider, MD   Allergies  Allergen Reactions  . Shellfish Allergy Swelling  . Penicillins Other (See Comments)    Childhood reaction   Patient's social and family history were reviewed.     Objective:   Physical Exam  Constitutional: He is oriented to person, place, and time. He appears well-developed and well-nourished. No distress.  HENT:  Head: Normocephalic and atraumatic.  Right Ear: Hearing normal.  Left Ear: Hearing normal.  Nose: Nose normal.  Eyes: Conjunctivae and lids are normal.  Right eye exhibits no discharge. Left eye exhibits no discharge. No scleral icterus.  Pulmonary/Chest: Effort normal. No respiratory distress.  Musculoskeletal: Normal range of motion.  Neurological: He is alert and oriented to person, place, and time.  Skin: Skin is warm and dry.  healed laceration over dorsal proximal 2nd left digit. Some mild swelling between MCP and PIP. No erythema, drainage or signs of infection. Strength intact. Unable to fully flex PIP. Sensation intact. #3 sutures removed  Psychiatric: He has a normal mood and affect. His speech is normal and behavior is normal. Thought content normal.   BP 114/70 mmHg  Pulse 70  Temp(Src) 98 F (36.7 C) (Oral)  Resp 16  Ht 5\' 11"  (1.803 m)  Wt 220 lb (99.791 kg)  BMI 30.70 kg/m2  SpO2 97%     Assessment & Plan:  1. Laceration of finger, subsequent encounter Healing well, no evidence infection. Difficulty flexing 2nd digit likely d/t his limiting his ROM. He will take ibuprofen 600 mg TID, apply ice and work on ROM. He will continue light duty for next 1 week and return in 1 week for follow up.   Roswell MinersNicole V. Dyke BrackettBush, PA-C, MHS [facility]  07/23/2014

## 2015-03-12 ENCOUNTER — Ambulatory Visit (INDEPENDENT_AMBULATORY_CARE_PROVIDER_SITE_OTHER): Payer: 59 | Admitting: Emergency Medicine

## 2015-03-12 VITALS — BP 118/78 | HR 85 | Temp 98.2°F | Resp 18 | Ht 71.5 in | Wt 232.2 lb

## 2015-03-12 DIAGNOSIS — K130 Diseases of lips: Secondary | ICD-10-CM

## 2015-03-12 MED ORDER — DOXYCYCLINE HYCLATE 100 MG PO TABS: 100.0000 mg | ORAL_TABLET | Freq: Two times a day (BID) | ORAL | Status: DC

## 2015-03-12 MED ORDER — MUPIROCIN 2 % EX OINT: TOPICAL_OINTMENT | CUTANEOUS | Status: DC

## 2015-03-12 NOTE — Patient Instructions (Signed)
Apply warm compresses to your lip. Return to clinic in 48-72 hours if not improved.

## 2015-03-12 NOTE — Progress Notes (Signed)
Patient ID: Corey FleetSean Milillo, male   DOB: 11-03-1980, 34 y.o.   MRN: [id number]    By signing my name below, I, Essence Howell, attest that this documentation has been prepared under the direction and in the presence of Collene GobbleSteven A Errika Narvaiz, MD Electronically Signed: Charline BillsEssence Howell, ED Scribe 03/12/2015 at 8:58 AM.  Chief Complaint:  Chief Complaint  Patient presents with  . Oral Swelling    Bottom lip, x1 day.    HPI: Corey Serrano is a 34 y.o. male who [facility] complaining of sudden onset of swelling to the bottom right lip onset this morning upon waking. Pt states that he shaved his facial hair approximately 2 weeks ago and suspects that he has an ingrown hair that is causing the swelling. He has tried applying a tea bag to area without significant relief. He denies difficulty breathing and sore throat. No h/o cold sore outbreaks. Allergy to PCN.   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  Past Medical History  Diagnosis Date  . Asthma    History reviewed. No pertinent past surgical history. Social History   Social History  . Marital Status: Single    Spouse Name: N/A  . Number of Children: N/A  . Years of Education: N/A   Social History Main Topics  . Smoking status: Former Smoker    Quit date: 07/15/2014  . Smokeless tobacco: None  . Alcohol Use: No  . Drug Use: No  . Sexual Activity: Not Asked   Other Topics Concern  . None   Social History Narrative   History reviewed. No pertinent family history. Allergies  Allergen Reactions  . Shellfish Allergy Swelling  . Penicillins Other (See Comments)    Childhood reaction   Prior to Admission medications   Medication Sig Start Date End Date Taking? Authorizing Provider  Multiple Vitamins-Minerals (MULTIVITAMIN & MINERAL PO) Take 1 tablet by mouth daily. Reported on 03/12/2015   Yes Historical Provider, MD  cephALEXin (KEFLEX) 500 MG capsule Take 1 capsule (500 mg total) by mouth 4  (four) times daily. Patient not taking: Reported on 03/12/2015 01/11/14   Antony MaduraKelly Humes, PA-C  ibuprofen (ADVIL,MOTRIN) 600 MG tablet Take 1 tablet (600 mg total) by mouth every 6 (six) hours as needed. Patient not taking: Reported on 03/12/2015 01/11/14   Antony MaduraKelly Humes, PA-C   ROS: The patient denies fevers, chills, night sweats, unintentional weight loss, sore throat, chest pain, palpitations, wheezing, SOB, nausea, vomiting, abdominal pain, dysuria, hematuria, melena, numbness, weakness, or tingling. +facial swelling  All other systems have been reviewed and were otherwise negative with the exception of those mentioned in the HPI and as above.    PHYSICAL EXAM: Filed Vitals:   03/12/15 0815  BP: 118/78  Pulse: 85  Temp: 98.2 F (36.8 C)  Resp: 18   Body mass index is 31.94 kg/(m^2).  General: Alert, no acute distress HEENT:  Normocephalic, atraumatic, oropharynx patent. 4 mm boil-like area to R lower lip with swelling of the R lower lip. Throat normal.  Neck: No adenopathy.  [facility]OMI[facility] Cardiovascular:  Regular rate and rhythm, no rubs murmurs or gallops.  No Carotid bruits, radial pulse intact. No pedal edema.  Respiratory: Clear to auscultation bilaterally.  No wheezes, rales, or rhonchi.  No cyanosis, no use of accessory musculature Abdominal: No organomegaly, abdomen is soft and non-tender, positive bowel sounds.  No masses. Musculoskeletal: Gait intact. No edema, tenderness Skin: No rashes. Neurologic: Facial musculature symmetric.  Psychiatric: Patient acts appropriately throughout our interaction. Lymphatic: No cervical or submandibular lymphadenopathy  LABS:  EKG/XRAY:    Primary read interpreted by [facility]. Cleta Alberts at [facility].   ASSESSMENT/PLAN: Patient has a boil of his right lower lip with associated lip swelling. Will treat with warm compresses, doxycycline, and Bactroban. Wound culture was sent.I personally performed the services described in this documentation, which  was scribed in my presence. The recorded information has been reviewed and is accurate.    Gross sideeffects, risk and benefits, and alternatives of medications d/w patient. Patient is aware that all medications have potential sideeffects and we are unable to predict every sideeffect or drug-drug interaction that may occur.  Lesle Chris MD 03/12/2015 8:48 AM

## 2015-03-14 LAB — WOUND CULTURE

## 2015-04-07 ENCOUNTER — Ambulatory Visit (INDEPENDENT_AMBULATORY_CARE_PROVIDER_SITE_OTHER): Payer: 59 | Admitting: Family Medicine

## 2015-04-07 ENCOUNTER — Ambulatory Visit (INDEPENDENT_AMBULATORY_CARE_PROVIDER_SITE_OTHER): Payer: 59

## 2015-04-07 VITALS — BP 120/76 | HR 95 | Temp 98.8°F | Resp 20 | Ht 71.5 in | Wt 238.8 lb

## 2015-04-07 DIAGNOSIS — R0789 Other chest pain: Secondary | ICD-10-CM

## 2015-04-07 DIAGNOSIS — W19XXXA Unspecified fall, initial encounter: Secondary | ICD-10-CM

## 2015-04-07 DIAGNOSIS — H6122 Impacted cerumen, left ear: Secondary | ICD-10-CM | POA: Diagnosis not present

## 2015-04-07 MED ORDER — CYCLOBENZAPRINE HCL 5 MG PO TABS: 5.0000 mg | ORAL_TABLET | Freq: Every day | ORAL | Status: DC

## 2015-04-07 NOTE — Progress Notes (Signed)
By signing my name below, I, Corey Serrano, attest that this documentation has been prepared under the direction and in the presence of Elvina Sidle, MD. Electronically Signed: Stann Serrano, Scribe. 04/07/2015 , 7:27 PM .  Patient was seen in room 2 .   Patient ID: Corey Serrano MRN: [id number], DOB: 07/20/1980, 35 y.o. Date of Encounter: 04/07/2015  Primary Physician: No PCP Per Patient  Chief Complaint:  Chief Complaint  Patient presents with  . Back Pain    fall in the snow   . Diarrhea    x 3 days    HPI:  Corey Serrano is a 35 y.o. male who [facility] complaining of back pain due falling in snow 9 days ago. He slipped and landed flat on his buttocks and tailbone. He hasn't been feeling well, states that it hurts just standing and bending down. He also informs that it hurts to cough and sneeze. He hasn't taken any medications for this. He's been using muscle rub without relief. He denies having gone to work since the fall. He denies hip pain.   He also notes having some diarrhea that started 3 days ago. After he eats something, he can't hold it in. He'll cough after eating about 3-4 times and then vomit it up. He denies fever.   He works with [employer].   Past Medical History  Diagnosis Date  . Asthma      Home Meds: Prior to Admission medications   Medication Sig Start Date End Date Taking? Authorizing Provider  Multiple Vitamins-Minerals (MULTIVITAMIN & MINERAL PO) Take 1 tablet by mouth daily. Reported on 03/12/2015   Yes Historical Provider, MD  mupirocin ointment (BACTROBAN) 2 % Apply small amount to outer lip twice a day. 03/12/15  Yes Collene Gobble, MD    Allergies:  Allergies  Allergen Reactions  . Shellfish Allergy Swelling  . Penicillins Other (See Comments)    Childhood reaction    Social History   Social History  . Marital Status: Single    Spouse Name: N/A  . Number of Children: N/A  . Years of  Education: N/A   Occupational History  . Not on file.   Social History Main Topics  . Smoking status: Former Smoker    Quit date: 07/15/2014  . Smokeless tobacco: Not on file  . Alcohol Use: No  . Drug Use: No  . Sexual Activity: Not on file   Other Topics Concern  . Not on file   Social History Narrative     Review of Systems: Constitutional: negative for fever, chills, night sweats, weight changes, or fatigue  HEENT: negative for vision changes, hearing loss, congestion, rhinorrhea, ST, epistaxis, or sinus pressure Cardiovascular: negative for chest pain or palpitations Respiratory: negative for hemoptysis, wheezing, shortness of breath, or cough Abdominal: negative for abdominal pain, nausea, vomiting, or constipation; positive for diarrhea, vomiting Dermatological: negative for rash Neurologic: negative for headache, dizziness, or syncope Musc: positive for back pain All other systems reviewed and are otherwise negative with the exception to those above and in the HPI.  Physical Exam: Blood pressure 120/76, pulse 95, temperature 98.8 F (37.1 C), temperature source Oral, resp. rate 20, height 5' 11.5" (1.816 m), weight 238 lb 12.8 oz (108.319 kg), SpO2 98 %., Body mass index is 32.85 kg/(m^2). General: Well developed, well nourished, in no acute distress. Head: Normocephalic, atraumatic, eyes without discharge, sclera non-icteric, nares are without discharge. TM's are without perforation, pearly grey  and translucent with reflective cone of light bilaterally. Oral cavity moist, posterior pharynx without exudate, erythema, peritonsillar abscess, or post nasal drip. Left ear canal has cerumen impaction Neck: Supple. No thyromegaly. Full ROM. No lymphadenopathy. Lungs: Clear bilaterally to auscultation without wheezes, rales, or rhonchi. Breathing is unlabored. Heart: RRR with S1 S2. No murmurs, rubs, or gallops appreciated.  Abdomen: Soft, non-tender. No abdominal masses. No  HSM, no rebound, no guarding Msk:  Strength and tone normal for age. Slight tenderness right lower ribs laterally, negative straight leg raises bilaterally Extremities/Skin: Warm and dry. No clubbing or cyanosis. No edema. No rashes or suspicious lesions. Neuro: Alert and oriented X 3. Moves all extremities spontaneously. Gait is normal. CNII-XII grossly in tact. reflexes normal Psych:  Responds to questions appropriately with a normal affect.   [facility] reading (PRIMARY) by Dr. Milus GlazierLauenstein : chest xray: no fractures seen  ASSESSMENT AND PLAN:  35 y.o. year old male with Chest wall pain - Plan: DG Ribs Unilateral W/Chest Right, cyclobenzaprine (FLEXERIL) 5 MG tablet  Fall, initial encounter - Plan: cyclobenzaprine (FLEXERIL) 5 MG tablet  Cerumen impaction, left      Signed, Elvina SidleKurt Greysen Swanton, MD 04/07/2015 7:27 PM

## 2015-04-07 NOTE — Patient Instructions (Signed)
Chest Wall Pain Chest wall pain is pain in or around the bones and muscles of your chest. Sometimes, an injury causes this pain. Sometimes, the cause may not be known. This pain may take several weeks or longer to get better. HOME CARE INSTRUCTIONS  Pay attention to any changes in your symptoms. Take these actions to help with your pain:   Rest as told by your health care provider.   Avoid activities that cause pain. These include any activities that use your chest muscles or your abdominal and side muscles to lift heavy items.   If directed, apply ice to the painful area:  Put ice in a plastic bag.  Place a towel between your skin and the bag.  Leave the ice on for 20 minutes, 2-3 times per day.  Take over-the-counter and prescription medicines only as told by your health care provider.  Do not use tobacco products, including cigarettes, chewing tobacco, and e-cigarettes. If you need help quitting, ask your health care provider.  Keep all follow-up visits as told by your health care provider. This is important. SEEK MEDICAL CARE IF:  You have a fever.  Your chest pain becomes worse.  You have new symptoms. SEEK IMMEDIATE MEDICAL CARE IF:  You have nausea or vomiting.  You feel sweaty or light-headed.  You have a cough with phlegm (sputum) or you cough up blood.  You develop shortness of breath.   This information is not intended to replace advice given to you by your health care provider. Make sure you discuss any questions you have with your health care provider.   Document Released: 03/08/2005 Document Revised: 11/27/2014 Document Reviewed: 06/03/2014 Elsevier Interactive Patient Education 2016 Elsevier Inc. Cerumen Impaction The structures of the external ear canal secrete a waxy substance known as cerumen. Excess cerumen can build up in the ear canal, causing a condition known as cerumen impaction. Cerumen impaction can cause ear pain and disrupt the function of  the ear. The rate of cerumen production differs for each individual. In certain individuals, the configuration of the ear canal may decrease his or her ability to naturally remove cerumen. CAUSES Cerumen impaction is caused by excessive cerumen production or buildup. RISK FACTORS  Frequent use of swabs to clean ears.  Having narrow ear canals.  Having eczema.  Being dehydrated. SIGNS AND SYMPTOMS  Diminished hearing.  Ear drainage.  Ear pain.  Ear itch. TREATMENT Treatment may involve:  Over-the-counter or prescription ear drops to soften the cerumen.  Removal of cerumen by a health care provider. This may be done with:  Irrigation with warm water. This is the most common method of removal.  Ear curettes and other instruments.  Surgery. This may be done in severe cases. HOME CARE INSTRUCTIONS  Take medicines only as directed by your health care provider.  Do not insert objects into the ear with the intent of cleaning the ear. PREVENTION  Do not insert objects into the ear, even with the intent of cleaning the ear. Removing cerumen as a part of normal hygiene is not necessary, and the use of swabs in the ear canal is not recommended.  Drink enough water to keep your urine clear or pale yellow.  Control your eczema if you have it. SEEK MEDICAL CARE IF:  You develop ear pain.  You develop bleeding from the ear.  The cerumen does not clear after you use ear drops as directed.   This information is not intended to replace advice given to  you by your health care provider. Make sure you discuss any questions you have with your health care provider.   Document Released: 04/15/2004 Document Revised: 03/29/2014 Document Reviewed: 10/23/2014 Elsevier Interactive Patient Education [employer] Elsevier Inc.

## 2015-04-15 ENCOUNTER — Other Ambulatory Visit: Payer: 59 | Admitting: Physician Assistant

## 2015-05-01 ENCOUNTER — Ambulatory Visit (INDEPENDENT_AMBULATORY_CARE_PROVIDER_SITE_OTHER): Payer: Worker's Compensation | Admitting: Physician Assistant

## 2015-05-01 VITALS — BP 110/70 | HR 93 | Temp 98.1°F | Ht 72.0 in | Wt 240.0 lb

## 2015-05-01 DIAGNOSIS — S61219D Laceration without foreign body of unspecified finger without damage to nail, subsequent encounter: Secondary | ICD-10-CM | POA: Diagnosis not present

## 2015-05-01 NOTE — Progress Notes (Signed)
[facility] [address] [place] [address] [phone]  Date:  05/01/2015   Name:  Corey Serrano   DOB:  October 01, 1980   MRN:  [id number]  PCP:  No PCP Per Patient    Chief Complaint: Wound Check and Finger Injury   History of Present Illness:  This is a 35 y.o. male who is presenting for follow up workers comp injury. He was seen here 07/23/14 for suture removal from a laceration on his left second digit. Laceration had been repaired at an UC in [place]. He had some decreased ROM of that finger at that time but otherwise healing well. I gave him a 1 week work note for light duty with instructions to work on ROM at home. He was to follow up in 1 week but he never did. He got an email from workers comp stating he needed to come in be seen asap to get a work note to be released to full duty. He reports no problems with his finger other than a raised scar that is occ tender. He has full ROM.  Review of Systems:  Review of Systems See HPI  There are no active problems to display for this patient.  Home meds: none  Allergies  Allergen Reactions  . Shellfish Allergy Swelling  . Penicillins Other (See Comments)    Childhood reaction    Medication list has been reviewed and updated.  Physical Examination:  Physical Exam  Constitutional: He is oriented to person, place, and time. He appears well-developed and well-nourished. No distress.  HENT:  Head: Normocephalic and atraumatic.  Right Ear: Hearing normal.  Left Ear: Hearing normal.  Nose: Nose normal.  Eyes: Conjunctivae and lids are normal. Right eye exhibits no discharge. Left eye exhibits no discharge. No scleral icterus.  Pulmonary/Chest: Effort normal. No respiratory distress.  Musculoskeletal: Normal range of motion.       Left hand: Normal. He exhibits normal range of motion. Normal sensation noted. Normal strength noted.  Healed laceration dorsal finger. Small papule at edge of laceration,  consistent with scar tissue. Mildly ttp.  Neurological: He is alert and oriented to person, place, and time.  Skin: Skin is warm, dry and intact. No rash noted.  Psychiatric: He has a normal mood and affect. His speech is normal and behavior is normal. Thought content normal.   BP 110/70 mmHg  Pulse 93  Temp(Src) 98.1 F (36.7 C) (Oral)  Ht 6' (1.829 m)  Wt 240 lb (108.863 kg)  BMI 32.54 kg/m2  SpO2 97%  Assessment and Plan:  1. Laceration of finger, subsequent encounter Healed well. Full ROM. Cleared for full work duties.   Roswell Miners Dyke Brackett, MHS [facility]  05/01/2015

## 2015-05-16 ENCOUNTER — Ambulatory Visit (INDEPENDENT_AMBULATORY_CARE_PROVIDER_SITE_OTHER): Payer: 59 | Admitting: Family Medicine

## 2015-05-16 VITALS — BP 110/70 | HR 92 | Temp 98.4°F | Resp 16 | Ht 72.0 in | Wt 239.0 lb

## 2015-05-16 DIAGNOSIS — R05 Cough: Secondary | ICD-10-CM

## 2015-05-16 DIAGNOSIS — J4521 Mild intermittent asthma with (acute) exacerbation: Secondary | ICD-10-CM | POA: Diagnosis not present

## 2015-05-16 DIAGNOSIS — R059 Cough, unspecified: Secondary | ICD-10-CM

## 2015-05-16 MED ORDER — PREDNISONE 20 MG PO TABS: ORAL_TABLET | ORAL | Status: DC

## 2015-05-16 MED ORDER — HYDROCODONE-HOMATROPINE 5-1.5 MG/5ML PO SYRP: 5.0000 mL | ORAL_SOLUTION | Freq: Three times a day (TID) | ORAL | Status: DC | PRN

## 2015-05-16 MED ORDER — ALBUTEROL SULFATE 108 (90 BASE) MCG/ACT IN AEPB: 2.0000 | INHALATION_SPRAY | Freq: Four times a day (QID) | RESPIRATORY_TRACT | Status: DC | PRN

## 2015-05-16 MED FILL — HYDROCODONE-HOMATROPINE SYR: 5-1.5 | 8 days supply | Qty: 120 | Fill #0

## 2015-05-16 MED FILL — PROAIR RESPICLICK INHAL PWD: 108 (90 BAS | 25 days supply | Qty: 1 | Fill #0

## 2015-05-16 NOTE — Patient Instructions (Signed)
Asthma Attack Prevention While you may not be able to control the fact that you have asthma, you can take actions to prevent asthma attacks. The best way to prevent asthma attacks is to maintain good control of your asthma. You can achieve this by:  Taking your medicines as directed.  Avoiding things that can irritate your airways or make your asthma symptoms worse (asthma triggers).  Keeping track of how well your asthma is controlled and of any changes in your symptoms.  Responding quickly to worsening asthma symptoms (asthma attack).  Seeking emergency care when it is needed. WHAT ARE SOME WAYS TO PREVENT AN ASTHMA ATTACK? Have a Plan Work with your health care provider to create a written plan for managing and treating your asthma attacks (asthma action plan). This plan includes:  A list of your asthma triggers and how you can avoid them.  Information on when medicines should be taken and when their dosages should be changed.  The use of a device that measures how well your lungs are working (peak flow meter). Monitor Your Asthma Use your peak flow meter and record your results in a journal every day. A drop in your peak flow numbers on one or more days may indicate the start of an asthma attack. This can happen even before you start to feel symptoms. You can prevent an asthma attack from getting worse by following the steps in your asthma action plan. Avoid Asthma Triggers Work with your asthma health care provider to find out what your asthma triggers are. This can be done by:  Allergy testing.  Keeping a journal that notes when asthma attacks occur and the factors that may have contributed to them.  Determining if there are other medical conditions that are making your asthma worse. Once you have determined your asthma triggers, take steps to avoid them. This may include avoiding excessive or prolonged exposure to:  Dust. Have someone dust and vacuum your home for you once or  twice a week. Using a high-efficiency particulate arrestance (HEPA) vacuum is best.  Smoke. This includes campfire smoke, forest fire smoke, and secondhand smoke from tobacco products.  Pet dander. Avoid contact with animals that you know you are allergic to.  Allergens from trees, grasses or pollens. Avoid spending a lot of time outdoors when pollen counts are high, and on very windy days.  Very cold, dry, or humid air.  Mold.  Foods that contain high amounts of sulfites.  Strong odors.  Outdoor air pollutants, such as engine exhaust.  Indoor air pollutants, such as aerosol sprays and fumes from household cleaners.  Household pests, including dust mites and cockroaches, and pest droppings.  Certain medicines, including NSAIDs. Always talk to your health care provider before stopping or starting any new medicines. Medicines Take over-the-counter and prescription medicines only as told by your health care provider. Many asthma attacks can be prevented by carefully following your medicine schedule. Taking your medicines correctly is especially important when you cannot avoid certain asthma triggers. Act Quickly If an asthma attack does happen, acting quickly can decrease how severe it is and how long it lasts. Take these steps:   Pay attention to your symptoms. If you are coughing, wheezing, or having difficulty breathing, do not wait to see if your symptoms go away on their own. Follow your asthma action plan.  If you have followed your asthma action plan and your symptoms are not improving, call your health care provider or seek immediate medical care   at the nearest hospital. It is important to note how often you need to use your fast-acting rescue inhaler. If you are using your rescue inhaler more often, it may mean that your asthma is not under control. Adjusting your asthma treatment plan may help you to prevent future asthma attacks and help you to gain better control of your  condition. HOW CAN I PREVENT AN ASTHMA ATTACK WHEN I EXERCISE? Follow advice from your health care provider about whether you should use your fast-acting inhaler before exercising. Many people with asthma experience exercise-induced bronchoconstriction (EIB). This condition often worsens during vigorous exercise in cold, humid, or dry environments. Usually, people with EIB can stay very active by pre-treating with a fast-acting inhaler before exercising.   This information is not intended to replace advice given to you by your health care provider. Make sure you discuss any questions you have with your health care provider.   Document Released: 02/24/2009 Document Revised: 11/27/2014 Document Reviewed: 08/08/2014 Elsevier Interactive Patient Education 2016 Elsevier Inc.  

## 2015-05-16 NOTE — Progress Notes (Signed)
 @  By signing my name below, I, Raven Small, attest that this documentation has been prepared under the direction and in the presence of Elvina Sidle, MD.  Electronically Signed: Andrew Au, ED Scribe. 05/16/2015. 9:37 AM.  Patient ID: Corey Serrano MRN: [id number], DOB: [birth year], 35 y.o. Date of Encounter: 05/16/2015, 9:36 AM  Primary Physician: No PCP Per Patient  Chief Complaint:  Chief Complaint  Patient presents with  . Cough    about a month  . Shortness of Breath    HPI: 35 y.o. year old male with history below presents with a dry, tickle cough that began a few weeks ago. Pt reports constant cough triggers HA's. He also states coughing fits tend to make him vomit.  He has hx of asthma but does not have an inhaler at home. Pt works for a [employer] in a dusty environment. Pt is smoker.     Past Medical History  Diagnosis Date  . Asthma      Home Meds: Prior to Admission medications   Medication Sig Start Date End Date Taking? Authorizing Provider  cyclobenzaprine (FLEXERIL) 5 MG tablet Take 1 tablet (5 mg total) by mouth at bedtime. Patient not taking: Reported on 05/01/2015 04/07/15   Elvina Sidle, MD  Multiple Vitamins-Minerals (MULTIVITAMIN & MINERAL PO) Take 1 tablet by mouth daily. Reported on 05/16/2015    Historical Provider, MD  mupirocin ointment (BACTROBAN) 2 % Apply small amount to outer lip twice a day. Patient not taking: Reported on 05/01/2015 03/12/15   Collene Gobble, MD    Allergies:  Allergies  Allergen Reactions  . Shellfish Allergy Swelling  . Penicillins Other (See Comments)    Childhood reaction    Social History   Social History  . Marital Status: Single    Spouse Name: N/A  . Number of Children: N/A  . Years of Education: N/A   Occupational History  . Not on file.   Social History Main Topics  . Smoking status: Former Smoker    Quit date: 07/15/2014  . Smokeless tobacco: Never Used  . Alcohol Use: No  . Drug Use:  No  . Sexual Activity: Not on file   Other Topics Concern  . Not on file   Social History Narrative    Review of Systems: Constitutional: negative for chills, fever, night sweats, weight changes, or fatigue  HEENT: negative for vision changes, hearing loss, congestion, rhinorrhea, ST, epistaxis, or sinus pressure Cardiovascular: negative for chest pain or palpitations Respiratory: negative for hemoptysis, wheezing, shortness of breath, or cough Abdominal: negative for abdominal pain, nausea, vomiting, diarrhea, or constipation Dermatological: negative for rash Neurologic: negative for headache, dizziness, or syncope All other systems reviewed and are otherwise negative with the exception to those above and in the HPI.   Physical Exam: Blood pressure 110/70, pulse 92, temperature 98.4 F (36.9 C), resp. rate 16, height 6' (1.829 m), weight 239 lb (108.41 kg), SpO2 98 %., Body mass index is 32.41 kg/(m^2). General: Well developed, well nourished, in no acute distress. Head: Normocephalic, atraumatic, eyes without discharge, sclera non-icteric, nares are without discharge. Bilateral auditory canals clear, TM's are without perforation, pearly grey and translucent with reflective cone of light bilaterally. Oral cavity moist, posterior pharynx without exudate, erythema, peritonsillar abscess, or post nasal drip.  Neck: Supple. No thyromegaly. Full ROM. No lymphadenopathy. Lungs: Clear bilaterally to auscultation without wheezes, rales, or rhonchi. Breathing is unlabored. Heart: RRR with S1 S2. No murmurs, rubs, or gallops appreciated. Abdomen: Soft,  non-tender, non-distended with normoactive bowel sounds. No hepatomegaly. No rebound/guarding. No obvious abdominal masses. Msk:  Strength and tone normal for age. Extremities/Skin: Warm and dry. No clubbing or cyanosis. No edema. No rashes or suspicious lesions. Neuro: Alert and oriented X 3. Moves all extremities spontaneously. Gait is normal.  CNII-XII grossly in tact. Psych:  Responds to questions appropriately with a normal affect.   Labs:   ASSESSMENT AND PLAN:  35 y.o. year old male with     ICD-9-CM ICD-10-CM   1. Cough 786.2 R05 predniSONE (DELTASONE) 20 MG tablet     Albuterol Sulfate (PROAIR RESPICLICK) 108 (90 Base) MCG/ACT AEPB     HYDROcodone-homatropine (HYCODAN) 5-1.5 MG/5ML syrup  2. Asthma, mild intermittent, with acute exacerbation 493.92 J45.21 predniSONE (DELTASONE) 20 MG tablet     Albuterol Sulfate (PROAIR RESPICLICK) 108 (90 Base) MCG/ACT AEPB     HYDROcodone-homatropine (HYCODAN) 5-1.5 MG/5ML syrup   This chart was scribed in my presence and reviewed by me personally.  Signed, Elvina Sidle, MD 05/16/2015 9:36 AM

## 2015-06-23 ENCOUNTER — Ambulatory Visit (INDEPENDENT_AMBULATORY_CARE_PROVIDER_SITE_OTHER): Payer: 59 | Admitting: Urgent Care

## 2015-06-23 ENCOUNTER — Ambulatory Visit: Payer: Worker's Compensation

## 2015-06-23 VITALS — BP 136/80 | HR 92 | Temp 98.2°F | Resp 18 | Ht 72.0 in | Wt 246.0 lb

## 2015-06-23 DIAGNOSIS — R0789 Other chest pain: Secondary | ICD-10-CM

## 2015-06-23 DIAGNOSIS — J029 Acute pharyngitis, unspecified: Secondary | ICD-10-CM | POA: Diagnosis not present

## 2015-06-23 DIAGNOSIS — R05 Cough: Secondary | ICD-10-CM

## 2015-06-23 DIAGNOSIS — R059 Cough, unspecified: Secondary | ICD-10-CM

## 2015-06-23 DIAGNOSIS — R0602 Shortness of breath: Secondary | ICD-10-CM | POA: Diagnosis not present

## 2015-06-23 DIAGNOSIS — J4540 Moderate persistent asthma, uncomplicated: Secondary | ICD-10-CM

## 2015-06-23 MED ORDER — IPRATROPIUM BROMIDE 0.02 % IN SOLN
0.5000 mg | Freq: Once | RESPIRATORY_TRACT | Status: AC
  Administered 2015-06-23: 0.5 mg via RESPIRATORY_TRACT

## 2015-06-23 MED ORDER — ALBUTEROL SULFATE (2.5 MG/3ML) 0.083% IN NEBU
2.5000 mg | INHALATION_SOLUTION | Freq: Once | RESPIRATORY_TRACT | Status: AC
  Administered 2015-06-23: 2.5 mg via RESPIRATORY_TRACT

## 2015-06-23 MED ORDER — HYDROCODONE-HOMATROPINE 5-1.5 MG/5ML PO SYRP: 5.0000 mL | ORAL_SOLUTION | Freq: Every evening | ORAL | Status: DC | PRN

## 2015-06-23 MED ORDER — PREDNISONE 20 MG PO TABS: ORAL_TABLET | ORAL | Status: DC

## 2015-06-23 MED ORDER — ALBUTEROL SULFATE HFA 108 (90 BASE) MCG/ACT IN AERS: 2.0000 | INHALATION_SPRAY | Freq: Four times a day (QID) | RESPIRATORY_TRACT | Status: DC | PRN

## 2015-06-23 NOTE — Patient Instructions (Addendum)
Cough, Adult Coughing is a reflex that clears your throat and your airways. Coughing helps to heal and protect your lungs. It is normal to cough occasionally, but a cough that happens with other symptoms or lasts a long time may be a sign of a condition that needs treatment. A cough may last only 2-3 weeks (acute), or it may last longer than 8 weeks (chronic). CAUSES Coughing is commonly caused by:  Breathing in substances that irritate your lungs.  A viral or bacterial respiratory infection.  Allergies.  Asthma.  Postnasal drip.  Smoking.  Acid backing up from the stomach into the esophagus (gastroesophageal reflux).  Certain medicines.  Chronic lung problems, including COPD (or rarely, lung cancer).  Other medical conditions such as heart failure. HOME CARE INSTRUCTIONS  Pay attention to any changes in your symptoms. Take these actions to help with your discomfort:  Take medicines only as told by your health care provider.  If you were prescribed an antibiotic medicine, take it as told by your health care provider. Do not stop taking the antibiotic even if you start to feel better.  Talk with your health care provider before you take a cough suppressant medicine.  Drink enough fluid to keep your urine clear or pale yellow.  If the air is dry, use a cold steam vaporizer or humidifier in your bedroom or your home to help loosen secretions.  Avoid anything that causes you to cough at work or at home.  If your cough is worse at night, try sleeping in a semi-upright position.  Avoid cigarette smoke. If you smoke, quit smoking. If you need help quitting, ask your health care provider.  Avoid caffeine.  Avoid alcohol.  Rest as needed. SEEK MEDICAL CARE IF:   You have new symptoms.  You cough up pus.  Your cough does not get better after 2-3 weeks, or your cough gets worse.  You cannot control your cough with suppressant medicines and you are losing sleep.  You  develop pain that is getting worse or pain that is not controlled with pain medicines.  You have a fever.  You have unexplained weight loss.  You have night sweats. SEEK IMMEDIATE MEDICAL CARE IF:  You cough up blood.  You have difficulty breathing.  Your heartbeat is very fast.   This information is not intended to replace advice given to you by your health care provider. Make sure you discuss any questions you have with your health care provider.   Document Released: 09/04/2010 Document Revised: 11/27/2014 Document Reviewed: 05/15/2014 Elsevier Interactive Patient Education 2016 Elsevier Inc.     IF you received an x-ray today, you will receive an invoice from [facility]. Please contact [facility] at [phone] with questions or concerns regarding your invoice.   IF you received labwork today, you will receive an invoice from [facility]. Please contact [facility] at [phone] with questions or concerns regarding your invoice.   Our billing staff will not be able to assist you with questions regarding bills from these companies.  You will be contacted with the lab results as soon as they are available. The fastest way to get your results is to activate your My Chart account. Instructions are located on the last page of this paperwork. If you have not heard from us regarding the results in 2 weeks, please contact this office.      

## 2015-06-23 NOTE — Progress Notes (Signed)
    MRN: [id number] DOB: 20-Mar-1981  Subjective:   Corey Serrano is a 35 y.o. male presenting for chief complaint of Cough; Back Pain; Chest Pain; and Emesis  Reports 2 week history of worsening productive cough, now elicits chest pain, shob, fatigue. Has a hard time taking a deep breath, cough also elicits back pain and has been having post-tussive emesis. Has a history of asthma, using his albuterol every 2 hours. Stopped smoking 2 weeks ago, used to smoke 1 ppd. Denies fever, abdominal pain.  Corey Serrano currently has no medications in their medication list. Also is allergic to shellfish allergy and penicillins.  Corey Serrano  has a past medical history of Asthma. Also  has no past surgical history on file.  Objective:   Vitals: BP 136/80 mmHg  Pulse 92  Temp(Src) 98.2 F (36.8 C) (Oral)  Resp 18  Ht 6' (1.829 m)  Wt 246 lb (111.585 kg)  BMI 33.36 kg/m2  SpO2 97%  Physical Exam  Constitutional: He is oriented to person, place, and time. He appears well-developed and well-nourished.  HENT:  Mouth/Throat: Oropharynx is clear and moist.  Eyes: No scleral icterus.  Cardiovascular: Normal rate, regular rhythm and intact distal pulses.  Exam reveals no gallop and no friction rub.   No murmur heard. Pulmonary/Chest: No respiratory distress. He has no wheezes. He has no rales.  Decreased lung sounds mid-lower bases bilaterally.  Abdominal: Soft. Bowel sounds are normal. He exhibits no distension and no mass. There is no tenderness.  Neurological: He is alert and oriented to person, place, and time.  Skin: Skin is warm and dry.   Dg Chest 2 View  06/23/2015  CLINICAL DATA:  Shortness of breath for 2 weeks EXAM: CHEST  2 VIEW COMPARISON:  July 06, 2015 FINDINGS: There is no edema or consolidation. Heart size and pulmonary vascularity are normal. No adenopathy. No bone lesions. IMPRESSION: No edema or consolidation. Electronically Signed   By: Bretta BangWilliam  Woodruff III M.D.   On: 06/23/2015 18:42    Assessment and Plan :   1. Cough 2. Atypical chest pain 3. Shortness of breath 4. Extrinsic asthma, moderate persistent, uncomplicated 5. Sore throat - Improved s/p nebulizer treatment. Schedule albuterol every 6 hours. Start short steroid course. Consider antibiotic course if no improvement despite steroid course. Patient agreed.  Wallis BambergMario Juna Caban, PA-C [facility] [phone] 06/23/2015 6:18 PM

## 2015-06-24 MED FILL — HYDROCODONE-HOMATROPINE SYR: 5-1.5 | 24 days supply | Qty: 120 | Fill #0

## 2015-06-24 MED FILL — predniSONE 20 MG TABS: 20 | 5 days supply | Qty: 10 | Fill #0

## 2015-06-29 ENCOUNTER — Ambulatory Visit: Payer: Worker's Compensation

## 2015-06-29 ENCOUNTER — Ambulatory Visit (INDEPENDENT_AMBULATORY_CARE_PROVIDER_SITE_OTHER): Payer: 59 | Admitting: Internal Medicine

## 2015-06-29 VITALS — BP 118/66 | HR 81 | Temp 97.8°F | Resp 16 | Ht 71.0 in | Wt 245.0 lb

## 2015-06-29 DIAGNOSIS — M5416 Radiculopathy, lumbar region: Secondary | ICD-10-CM

## 2015-06-29 DIAGNOSIS — R202 Paresthesia of skin: Secondary | ICD-10-CM | POA: Diagnosis not present

## 2015-06-29 DIAGNOSIS — J4521 Mild intermittent asthma with (acute) exacerbation: Secondary | ICD-10-CM

## 2015-06-29 MED ORDER — PREDNISONE 20 MG PO TABS: ORAL_TABLET | ORAL | Status: DC

## 2015-06-29 NOTE — Patient Instructions (Signed)
     IF you received an x-ray today, you will receive an invoice from [facility]. Please contact [facility] at [phone] with questions or concerns regarding your invoice.   IF you received labwork today, you will receive an invoice from [facility]. Please contact [facility] at [phone] with questions or concerns regarding your invoice.   Our billing staff will not be able to assist you with questions regarding bills from these companies.  You will be contacted with the lab results as soon as they are available. The fastest way to get your results is to activate your My Chart account. Instructions are located on the last page of this paperwork. If you have not heard from us regarding the results in 2 weeks, please contact this office.      

## 2015-06-29 NOTE — Progress Notes (Signed)
Subjective:  By signing my name below, I, Corey Serrano, attest that this documentation has been prepared under the direction and in the presence of [name], MD. Electronically Signed: Stann Serrano, Scribe. 06/29/2015 , 1:57 PM .  Patient was seen in Room 14 .   Patient ID: Corey Serrano, male    DOB: 05/29/1980, 35 y.o.   MRN: [id number] Chief Complaint  Patient presents with  . Follow-up    asthma  . Leg Pain    both legs from knee down is feeling numb and tight   HPI Corey Serrano is a 35 y.o. male who has h/o asthma presents to [facility] for follow up. He was seen 6 days ago by Wallis Bamberg, PA-C, for asthma flare up. He's feeling better but still has some wheezing. He denies any interference with activity or work. He reports having more flare ups when he was younger as a child.   He's been having an intermittent burning sensation around his calves bilaterally, "like being at the gym, worked out for several hours". This has affected his walking, causing him to walk hunch back. He states the worst part is getting out of bed in the morning. Throughout the day, he continues to feel tightness in his calves. While in the office, he mentions having a tingling sensation radiating down his calves bilaterally. He denies any pain in his feet.   There are no active problems to display for this patient. Hx RAD/ asth as child  Current outpatient prescriptions:  .  albuterol (PROVENTIL HFA;VENTOLIN HFA) 108 (90 Base) MCG/ACT inhaler, Inhale 2 puffs into the lungs every 6 (six) hours as needed for wheezing or shortness of breath (cough, shortness of breath or wheezing.)., Disp: 1 Inhaler, Rfl: 1 .  HYDROcodone-homatropine (HYCODAN) 5-1.5 MG/5ML syrup, Take 5 mLs by mouth at bedtime as needed., Disp: 120 mL, Rfl: 0 .  predniSONE (DELTASONE) 20 MG tablet, Take 2 tablets daily with breakfast. (Patient not taking: Reported on 06/29/2015), Disp: 10 tablet, Rfl: 0  Review of Systems  Constitutional:  Negative for fever, chills and fatigue.  Respiratory: Positive for wheezing. Negative for cough and shortness of breath.   Cardiovascular: Negative for chest pain, palpitations and leg swelling.  Gastrointestinal: Negative for abdominal pain.  Genitourinary: Negative for difficulty urinating.  Musculoskeletal: Positive for myalgias and gait problem. Negative for back pain, joint swelling and arthralgias.  Skin: Negative for rash and wound.  Neurological: Negative for tremors, weakness, numbness and headaches.  Psychiatric/Behavioral: Negative for behavioral problems and sleep disturbance.      Objective:   Physical Exam  Constitutional: He is oriented to person, place, and time. He appears well-developed and well-nourished. No distress.  HENT:  Head: Normocephalic and atraumatic.  Eyes: EOM are normal. Pupils are equal, round, and reactive to light.  Neck: Neck supple.  Cardiovascular: Normal rate.   Pulmonary/Chest: Effort normal. No respiratory distress.  Musculoskeletal: Normal range of motion.  No swelling of the lower extremities, no sensory loss, calves and tibia non tender to palpation, pain in lumbar area with rotational movements, but negative seated straight leg raise, up to 90 degrees bilaterally  Neurological: He is alert and oriented to person, place, and time.  Reflex Scores:      Patellar reflexes are 2+ on the right side and 2+ on the left side.      Achilles reflexes are 2+ on the right side and 2+ on the left side. Skin: Skin is warm and dry.  Psychiatric: He  has a normal mood and affect. His behavior is normal.  Nursing note and vitals reviewed.  BP 118/66 mmHg  Pulse 81  Temp(Src) 97.8 F (36.6 C) (Oral)  Resp 16  Ht 5\' 11"  (1.803 m)  Wt 245 lb (111.131 kg)  BMI 34.19 kg/m2  SpO2 97%   Dg Lumbar Spine 2-3 Views  06/29/2015  CLINICAL DATA:  He's been having an intermittent burning sensation around his calves bilaterally, "like being at the gym, worked out  for several hours". This has affected his walking, causing him to walk hunch back. He states the worst part is getting out of bed in the morning. Throughout the day, he continues to feel tightness in his calves. While in the office, he mentions having a tingling sensation radiating down his calves bilaterally - No accident or injuries. EXAM: LUMBAR SPINE - 2-3 VIEW COMPARISON:  None. FINDINGS: There is no evidence of lumbar spine fracture. Alignment is normal. Intervertebral disc spaces are maintained. IMPRESSION: Negative. Electronically Signed   By: Amie Portlandavid  Ormond M.D.   On: 06/29/2015 14:26      Assessment & Plan:  Lumbar radiculopathy, acute - Plan: DG Lumbar Spine 2-3 Views, Ambulatory referral to Neurology  Paresthesias - Plan: Ambulatory referral to Neurology  Asthma, mild intermittent, with acute exacerbation---improved but should resolve w/ pred and ? Effect on nerve sxtoms  Meds ordered this encounter  Medications  . predniSONE (DELTASONE) 20 MG tablet    Sig: 4/3/2/1/1 single daily dose for 5 days    Dispense:  11 tablet    Refill:  0   Needs eval to r/o spinal stenosis I have completed the patient encounter in its entirety as documented by the scribe, with editing by me where necessary. Shine Mikes P. Merla Richesoolittle, M.D.

## 2015-07-10 ENCOUNTER — Ambulatory Visit (INDEPENDENT_AMBULATORY_CARE_PROVIDER_SITE_OTHER): Payer: 59 | Admitting: Family Medicine

## 2015-07-10 VITALS — BP 118/90 | HR 101 | Temp 98.3°F | Resp 18 | Ht 71.0 in | Wt 245.6 lb

## 2015-07-10 DIAGNOSIS — R51 Headache: Secondary | ICD-10-CM

## 2015-07-10 DIAGNOSIS — R200 Anesthesia of skin: Secondary | ICD-10-CM

## 2015-07-10 DIAGNOSIS — R519 Headache, unspecified: Secondary | ICD-10-CM

## 2015-07-10 DIAGNOSIS — Z79899 Other long term (current) drug therapy: Secondary | ICD-10-CM | POA: Diagnosis not present

## 2015-07-10 DIAGNOSIS — R42 Dizziness and giddiness: Secondary | ICD-10-CM | POA: Diagnosis not present

## 2015-07-10 DIAGNOSIS — R202 Paresthesia of skin: Secondary | ICD-10-CM

## 2015-07-10 LAB — POCT CBC
Granulocyte percent: 63.5 %G (ref 37–80)
HEMATOCRIT: 40.1 % — AB (ref 43.5–53.7)
Hemoglobin: 14.6 g/dL (ref 14.1–18.1)
LYMPH, POC: 2.9 (ref 0.6–3.4)
MCH, POC: 32 pg — AB (ref 27–31.2)
MCHC: 36.3 g/dL — AB (ref 31.8–35.4)
MCV: 88 fL (ref 80–97)
MID (cbc): 0.7 (ref 0–0.9)
MPV: 7.3 fL (ref 0–99.8)
POC GRANULOCYTE: 6.3 (ref 2–6.9)
POC LYMPH %: 29.3 % (ref 10–50)
POC MID %: 7.2 % (ref 0–12)
Platelet Count, POC: 195 10*3/uL (ref 142–424)
RBC: 4.55 M/uL — AB (ref 4.69–6.13)
RDW, POC: 12.5 %
WBC: 10 10*3/uL (ref 4.6–10.2)

## 2015-07-10 LAB — BASIC METABOLIC PANEL
BUN: 13 mg/dL (ref 7–25)
CALCIUM: 8.9 mg/dL (ref 8.6–10.3)
CO2: 25 mmol/L (ref 20–31)
CREATININE: 1.3 mg/dL (ref 0.60–1.35)
Chloride: 104 mmol/L (ref 98–110)
GLUCOSE: 94 mg/dL (ref 65–99)
Potassium: 4.2 mmol/L (ref 3.5–5.3)
Sodium: 139 mmol/L (ref 135–146)

## 2015-07-10 LAB — GLUCOSE, POCT (MANUAL RESULT ENTRY): POC Glucose: 111 mg/dl — AB (ref 70–99)

## 2015-07-10 NOTE — Patient Instructions (Addendum)
     IF you received an x-ray today, you will receive an invoice from [facility]. Please contact [facility] at [phone] with questions or concerns regarding your invoice.   IF you received labwork today, you will receive an invoice from [facility]. Please contact [facility] at [phone] with questions or concerns regarding your invoice.   Our billing staff will not be able to assist you with questions regarding bills from these companies.  You will be contacted with the lab results as soon as they are available. The fastest way to get your results is to activate your My Chart account. Instructions are located on the last page of this paperwork. If you have not heard from us regarding the results in 2 weeks, please contact this office.    I spoke with the neurologist, and they will see you in their office tomorrow. I would expect a phone call from them today or at the latest tomorrow morning. In the meantime if there are anynew symptoms or progression of your symptoms, go to the emergency room as we discussed.

## 2015-07-10 NOTE — Progress Notes (Addendum)
Subjective:  By signing my name below, I, [name], attest that this documentation has been prepared under the direction and in the presence of Meredith Staggers, MD. Electronically Signed: [name], Scribe. 07/10/2015 , 1:12 PM .  Patient was seen in Room 1 .   Patient ID: Corey Serrano, male    DOB: [birth year], 35 y.o.   MRN: [id number] Chief Complaint  Patient presents with  . Numbness    x past two days, has felt numbness on the left side of his body, hardly get out if the bed this morning  . Dizziness   HPI Corey Serrano is a 35 y.o. male Here for numbness on left side of face and left side body. He was seen 11 days ago at that time for paresthesias and burning in bilateral calves; suspected possible lumbar radiculopathy, referred to neurology. He had been treated 6 days prior with asthma exacerbated, treated with prednisone  bid. New prescription prednisone taper from  to  starting 4/9.   Patient states having persistent pressure and numbness over the left side of his face, from left temple down to his jaw, that started 2 days ago. He also reports having constant frontal headaches and dizziness when the pressure and numbness started. It started with waking up around 6:00AM with the headache 2 days ago. He notes photophobia at that time. He went to work last night with the headache. He's also feeling facial puffiness on the left side. He denies neck pain, neck stiffness, or tooth pain. He denies weakness or numbness in his left arm. He denies illicit drug use or any alcohol use.   He's having a MRI done tomorrow for the tightness and numbness in his legs. He informs the numbness starting from his knees bilaterally and down into his calves. He denies weakness. He's been using albuterol inhaler to prevent wheezing.   There are no active problems to display for this patient.  Past Medical History  Diagnosis Date  . Asthma    History reviewed. No pertinent past surgical  history. Allergies  Allergen Reactions  . Shellfish Allergy Swelling  . Penicillins Other (See Comments)    Childhood reaction   Prior to Admission medications   Medication Sig Start Date End Date Taking? Authorizing Provider  albuterol (PROVENTIL HFA;VENTOLIN HFA) 108 (90 Base) MCG/ACT inhaler Inhale 2 puffs into the lungs every 6 (six) hours as needed for wheezing or shortness of breath (cough, shortness of breath or wheezing.). 06/23/15  Yes Wallis Bamberg, PA-C  HYDROcodone-homatropine [facility]) 5-1.5 MG/5ML syrup Take 5 mLs by mouth at bedtime as needed. 06/23/15  Yes Wallis Bamberg, PA-C  predniSONE (DELTASONE) 20 MG tablet 4/3/2/1/1 single daily dose for 5 days 06/29/15  Yes Tonye Pearson, MD   Social History   Social History  . Marital Status: Single    Spouse Name: N/A  . Number of Children: N/A  . Years of Education: N/A   Occupational History  . Not on file.   Social History Main Topics  . Smoking status: Former Smoker    Quit date: 07/15/2014  . Smokeless tobacco: Never Used  . Alcohol Use: No  . Drug Use: No  . Sexual Activity: Not on file   Other Topics Concern  . Not on file   Social History Narrative   Review of Systems  Constitutional: Positive for fatigue. Negative for fever and chills.  HENT: Positive for facial swelling. Negative for dental problem.   Eyes: Positive for photophobia.  Musculoskeletal:  Positive for myalgias. Negative for neck pain and neck stiffness.  Neurological: Positive for dizziness, numbness and headaches. Negative for speech difficulty and weakness.       Objective:   Physical Exam  Constitutional: He is oriented to person, place, and time. He appears well-developed and well-nourished. No distress.  HENT:  Head: Normocephalic and atraumatic.  Nose: Left sinus exhibits maxillary sinus tenderness.  No rash or apparent swelling of face, decreased sensation left frontal scalp and left maxillary face, difficult to assess beard area, non  tender including at behind angle of jaw  Eyes: EOM are normal. Pupils are equal, round, and reactive to light. Right eye exhibits no nystagmus. Left eye exhibits no nystagmus.  Neck: Neck supple.  Cardiovascular: Normal rate, regular rhythm and normal heart sounds.  Exam reveals no gallop and no friction rub.   No murmur heard. Pulmonary/Chest: Effort normal and breath sounds normal. No respiratory distress.  Musculoskeletal: Normal range of motion.  Equal upper and lower extremities strength  Lymphadenopathy:    He has no cervical adenopathy.  Neurological: He is alert and oriented to person, place, and time.  equal facial movements, no droop, negative romberg, no pronator drift, normal heel to toe, normal finger to nose  Skin: Skin is warm and dry.  Psychiatric: He has a normal mood and affect. His behavior is normal.  Nursing note and vitals reviewed.   Filed Vitals:   07/10/15 1223  BP: 118/90  Pulse: 101  Temp: 98.3 F (36.8 C)  TempSrc: Oral  Resp: 18  Height: 5\' 11"  (1.803 m)  Weight: 245 lb 9.6 oz (111.403 kg)  SpO2: 98%   EKG: Sinus rhythm rate 72, non specific ST-Twave in lead III, and probable early repol in v2.   Results for orders placed or performed in visit on 07/10/15  POCT glucose (manual entry)  Result Value Ref Range   POC Glucose 111 (A) 70 - 99 mg/dl  POCT CBC  Result Value Ref Range   WBC 10.0 4.6 - 10.2 K/uL   Lymph, poc 2.9 0.6 - 3.4   POC LYMPH PERCENT 29.3 10 - 50 %L   MID (cbc) 0.7 0 - 0.9   POC MID % 7.2 0 - 12 %M   POC Granulocyte 6.3 2 - 6.9   Granulocyte percent 63.5 37 - 80 %G   RBC 4.55 (A) 4.69 - 6.13 M/uL   Hemoglobin 14.6 14.1 - 18.1 g/dL   HCT, POC 96.040.1 (A) 45.443.5 - 53.7 %   MCV 88.0 80 - 97 fL   MCH, POC 32.0 (A) 27 - 31.2 pg   MCHC 36.3 (A) 31.8 - 35.4 g/dL   RDW, POC 09.812.5 %   Platelet Count, POC 195 142 - 424 K/uL   MPV 7.3 0 - 99.8 fL       Assessment & Plan:  Corey Serrano is a 35 y.o. male Numbness - Plan: EKG 12-Lead,  POCT glucose (manual entry), POCT CBC, Basic metabolic panel, Ambulatory referral to Neurology  Numbness and tingling of left side of face - Plan: POCT glucose (manual entry), POCT CBC, Basic metabolic panel, Ambulatory referral to Neurology  Nonintractable headache, unspecified chronicity pattern, unspecified headache type - Plan: Ambulatory referral to Neurology  High risk medication use - Plan: POCT glucose (manual entry), Ambulatory referral to Neurology  Dizziness - Plan: POCT glucose (manual entry), POCT CBC, Basic metabolic panel, Ambulatory referral to Neurology  Previous dysesthesias in lower extremities, initially treated with prednisone taper. Now with  three-day history of headache, left face numbness, episodic dizziness, and persistent dysesthesias of lower extremities. Reflexes intact lower extremities, strength intact and equal upper and lower extremities, no focal weakness on exam. Reassuring CBC, glucose, EKG.  - Discussed with neurology, will have him be seen tomorrow with neurologist. No other imaging at this time, but if any increase or worsening symptoms prior to that visit, go to the emergency room. Understanding expressed.  No orders of the defined types were placed in this encounter.   Patient Instructions       IF you received an x-ray today, you will receive an invoice from [facility]. Please contact [facility] at [phone] with questions or concerns regarding your invoice.   IF you received labwork today, you will receive an invoice from [facility]. Please contact [facility] at [phone] with questions or concerns regarding your invoice.   Our billing staff will not be able to assist you with questions regarding bills from these companies.  You will be contacted with the lab results as soon as they are available. The fastest way to get your results is to activate your My Chart account. Instructions are located on  the last page of this paperwork. If you have not heard from [place] regarding the results in 2 weeks, please contact this office.    I spoke with the neurologist, and they will see you in their office tomorrow. I would expect a phone call from them today or at the latest tomorrow morning. In the meantime if there are anynew symptoms or progression of your symptoms, go to the emergency room as we discussed.       I personally performed the services described in this documentation, which was scribed in my presence. The recorded information has been reviewed and considered, and addended by me as needed.

## 2015-07-11 ENCOUNTER — Encounter: Payer: Self-pay | Admitting: Neurology

## 2015-07-11 ENCOUNTER — Ambulatory Visit (INDEPENDENT_AMBULATORY_CARE_PROVIDER_SITE_OTHER): Payer: 59 | Admitting: Neurology

## 2015-07-11 VITALS — BP 123/81 | HR 87 | Ht 71.0 in | Wt 241.4 lb

## 2015-07-11 DIAGNOSIS — S338XXA Sprain of other parts of lumbar spine and pelvis, initial encounter: Secondary | ICD-10-CM

## 2015-07-11 DIAGNOSIS — M545 Low back pain: Secondary | ICD-10-CM

## 2015-07-11 DIAGNOSIS — M7918 Myalgia, other site: Secondary | ICD-10-CM

## 2015-07-11 DIAGNOSIS — R29898 Other symptoms and signs involving the musculoskeletal system: Secondary | ICD-10-CM | POA: Diagnosis not present

## 2015-07-11 DIAGNOSIS — R0683 Snoring: Secondary | ICD-10-CM

## 2015-07-11 DIAGNOSIS — S39012A Strain of muscle, fascia and tendon of lower back, initial encounter: Secondary | ICD-10-CM

## 2015-07-11 DIAGNOSIS — S86819A Strain of other muscle(s) and tendon(s) at lower leg level, unspecified leg, initial encounter: Secondary | ICD-10-CM

## 2015-07-11 DIAGNOSIS — M5416 Radiculopathy, lumbar region: Secondary | ICD-10-CM | POA: Diagnosis not present

## 2015-07-11 DIAGNOSIS — M79669 Pain in unspecified lower leg: Secondary | ICD-10-CM

## 2015-07-11 DIAGNOSIS — G4710 Hypersomnia, unspecified: Secondary | ICD-10-CM

## 2015-07-11 DIAGNOSIS — R400 Somnolence: Secondary | ICD-10-CM

## 2015-07-11 DIAGNOSIS — R51 Headache: Secondary | ICD-10-CM

## 2015-07-11 DIAGNOSIS — R519 Headache, unspecified: Secondary | ICD-10-CM

## 2015-07-11 MED ORDER — CYCLOBENZAPRINE HCL 10 MG PO TABS
10.0000 mg | ORAL_TABLET | Freq: Three times a day (TID) | ORAL | Status: DC | PRN
Start: 2015-07-11 — End: 2017-04-06

## 2015-07-11 NOTE — Patient Instructions (Addendum)
Overall you are doing fairly well but I do want to suggest a few things today:   Remember to drink plenty of fluid, eat healthy meals and do not skip any meals. Try to eat protein with a every meal and eat a healthy snack such as fruit or nuts in between meals. Try to keep a regular sleep-wake schedule and try to exercise daily, particularly in the form of walking, 20-30 minutes a day, if you can.   As far as your medications are concerned, I would like to suggest: Flexeril 10mg  up to 3x a day. Watch for sedation.   As far as diagnostic testing: MRI of the lumbar spine,EMG/NCS, Labs, Sleep study  I would like to see you back for emg/ncs, sooner if we need to. Please call us with any interim questions, concerns, problems, updates or refill requests. Dr. Neva SeatGreene has referred you for evalkuation of another new problem, you need to schedule a separate appointment to discuss this.   Our phone number is [phone]. We also have an after hours call service for urgent matters and there is a physician on-call for urgent questions. For any emergencies you know to call 911 or go to the nearest emergency room

## 2015-07-11 NOTE — Progress Notes (Signed)
[facility]    Provider:  Dr Lucia Gaskins Referring Provider: Tonye Pearson, MD Primary Care Physician:  Doolittle,robert MD  CC:  Pain in the legs  HPI:  Corey Serrano is a 35 y.o. male here as a referral from Dr. Merla Riches for pain in the calves. PMHx asthma, low back pain, leg muscle pain.  Started after he fell down 2 months ago. He bruised his ribs. Since then he has had dififculty sitting for long periods. He woke up one morning and had severe pain in the calfs and legs, he lost all sensation, he felt a pulling in the calves and tigthness in the anterior lower legs. Tried stretching, walking, nothing worked. The pain improved throughout the day a little but the pain put tears in his eyes it was so severe. No issues in the feet. He felt burning inside the muscle but no sensory changes on the skin but the skin felt tight. 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 He has low back pain, tightness. He can be sitting at home and his side will start to cramp up where he hit it on the right, his lower back feels sore, tightening muscles, once he felt like someone was shooting a rocket into both legs down the back of the thighs. He feel fatigued but no focal weakness. He snores at night and can sometimes "bring the house down with my snoring", he wakes up with headaches, excessively tired during the day sometimes he almost falls asleep while driving and has to pull over. He can't drive long distances, he will nod off when stopped in traffic. He has numbness on the left side of his face with pressure and headaches. He went to urgent care and he is scheduled for an MRi of the brain for this complaint and is following with them Meredith Staggers).  Reviewed notes, labs and imaging from outside physicians, which showed:  He was seen by dr. Merla Riches on April 9th. He's  been having an intermittent burning sensation around his calves bilaterally, "like being at the gym, worked out for several hours". This has affected his walking, causing him to walk hunch back. He states the worst part is getting out of bed in the morning. Throughout the day, he continues to feel tightness in his calves. While in the office, he mentions having a tingling sensation radiating down his calves bilaterally. He denies any pain in his feet.   Was seen by Dr. Chilton Si on 4/20 for left-sided numbness (face and body). Recent steroid taper for asthma exacerbation. Patient reported persistent pressure and numbness over the left side of his face, from left temple down to his jaw, that started 2 days previous. He also reported having constant frontal headaches and dizziness when the pressure and numbness started. It started with waking up around 6:00AM with the headache 2 days previous  Review of Systems: Patient complains of symptoms per HPI as well as the following symptoms: fatigue, headache, numbness, aching muscles, numbness, weakness, insomnia, restless legs. Pertinent negatives per HPI. All others negative.   Social History   Social History  . Marital Status: Single    Spouse Name: N/A  . Number of Children: 2  . Years of Education: 13   Occupational History  . [employer] and [employer] account manager   . Bouncer- bar    Social History Main Topics  .  Smoking status: Former Smoker    Quit date: 07/15/2014  . Smokeless tobacco: Never Used  . Alcohol Use: No  . Drug Use: No  . Sexual Activity: Not on file   Other Topics Concern  . Not on file   Social History Narrative   Lives   Caffeine use: Soda (5 cans/week)    Family History  Problem Relation Age of Onset  . Hypertension Mother     Past Medical History  Diagnosis Date  . Asthma     Past Surgical History  Procedure Laterality Date  . No past surgeries      Current Outpatient Prescriptions  Medication Sig  Dispense Refill  . albuterol (PROVENTIL HFA;VENTOLIN HFA) 108 (90 Base) MCG/ACT inhaler Inhale 2 puffs into the lungs every 6 (six) hours as needed for wheezing or shortness of breath (cough, shortness of breath or wheezing.). 1 Inhaler 1  . cyclobenzaprine (FLEXERIL) 10 MG tablet Take 1 tablet (10 mg total) by mouth 3 (three) times daily as needed for muscle spasms. 90 tablet 3   No current facility-administered medications for this visit.    Allergies as of 07/11/2015 - Review Complete 07/11/2015  Allergen Reaction Noted  . Shellfish allergy Swelling 08/27/2012  . Penicillins Other (See Comments) 08/27/2012    Vitals: BP 123/81 mmHg  Pulse 87  Ht 5\' 11"  (1.803 m)  Wt 241 lb 6.4 oz (109.498 kg)  BMI 33.68 kg/m2 Last Weight:  Wt Readings from Last 1 Encounters:  07/11/15 241 lb 6.4 oz (109.498 kg)   Last Height:   Ht Readings from Last 1 Encounters:  07/11/15 5\' 11"  (1.803 m)    Physical exam: Exam: Gen: NAD, conversant, well nourised, obese, well groomed                     CV: RRR, no MRG. No Carotid Bruits. No peripheral edema, warm, nontender Eyes: Conjunctivae clear without exudates or hemorrhage MSK: muscular, tightness in the bilateral calfs, paraspinal tihtness.   Neuro: Detailed Neurologic Exam  Speech:    Speech is normal; fluent and spontaneous with normal comprehension.  Cognition:    The patient is oriented to person, place, and time;     recent and remote memory intact;     language fluent;     normal attention, concentration,     fund of knowledge Cranial Nerves:    The pupils are equal, round, and reactive to light. The fundi are normal and spontaneous venous pulsations are present. Visual fields are full to finger confrontation. Extraocular movements are intact. Trigeminal sensation is intact and the muscles of mastication are normal. The face is symmetric. The palate elevates in the midline. Hearing intact. Voice is normal. Shoulder shrug is normal.  The tongue has normal motion without fasciculations.   Coordination:    Normal finger to nose and heel to shin. Normal rapid alternating movements.   Gait:    Heel-toe and tandem gait are normal.   Motor Observation:    No asymmetry, no atrophy, and no involuntary movements noted. Tone:    Normal muscle tone.    Posture:    Posture is normal. normal erect    Strength: Right biceps femoris (leg flexion) very mild weakness otherwise strength is V/V in the upper and lower limbs.      Sensation: intact to LT     Reflex Exam:  DTR's:    Deep tendon reflexes in the upper and lower extremities are normal bilaterally.  Toes:    The toes are downgoing bilaterally.   Clonus:    Clonus is absent.    Assessment/Plan:  35 year old male here for low back pain, pain in the calfs from Dr. Merla Riches. He has a very manual job, this may be muscle strain and musculoskeletal low back pain.  Patient did not discuss his left-sided numbness today with me, he is asked to come back to clinic for another appointment to discuss evaluation of this. It is his responsibility to schedule an appointment for this, cannot address everything today.     physicaal therapy for his low back pain and calf pain likely muscular strain. MRi lumbar spine due to his c/o radicular pain with some weakness in the right biceps femoris (leg flexion) EMG/ncs of the lower extremities Labs including CK (warned patient I suspect CK will be slightly elevated given his very muscular nature, not concerning if mildly elevated) Muscle relaxers for pain  Sleep evaluation for OSA (He snores at night and can sometimes "bring the house down with my snoring", he wakes up with headaches, excessively tired during the day sometimes he almost falls asleep while driving and has to pull over. He can't drive long distances, he will nod off when stopped in traffic) Patient did not address his facial numbness with me today, cannot address everything  today, let patient know this. He needs to schedule a follow up for this other issue as this is a different issue with a different referring doctor (Dr. Neva Seat). It is entirely his responsibility to schedule to address this issue as well as a new issue.   Naomie Dean, MD  [facility] [address] [place], [place] [address]  Phone [phone] Fax [phone]

## 2015-07-14 LAB — MAGNESIUM: MAGNESIUM: 2.3 mg/dL (ref 1.6–2.3)

## 2015-07-14 LAB — ALDOLASE: ALDOLASE: 7.3 U/L (ref 3.3–10.3)

## 2015-07-14 LAB — LACTIC ACID, PLASMA: LACTATE: 7.9 mg/dL (ref 4.5–19.8)

## 2015-07-14 LAB — CK: Total CK: 338 U/L — ABNORMAL HIGH (ref 24–204)

## 2015-07-16 ENCOUNTER — Other Ambulatory Visit: Payer: Self-pay | Admitting: Neurology

## 2015-07-17 ENCOUNTER — Telehealth: Payer: Self-pay | Admitting: *Deleted

## 2015-07-17 NOTE — Telephone Encounter (Signed)
Called pt and relayed results per Dr Lucia GaskinsAhern note. He verbalized understanding. I encouraged he keep appt for 08/06/15 at 1230pm for NCS/EMG, check in 1200pm.

## 2015-07-17 NOTE — Telephone Encounter (Signed)
-----   Message from Anson FretAntonia B Ahern, MD sent at 07/16/2015  9:10 PM EDT ----- Patient's CK was slightly elevated, this is a muscle enzyme. But I did warn him that I thought this would be the case since he is so muscular. I am not concerned with this slight elevation, I think he has a lot of muscle bulk. We will further evaluate at emg/ncs. Otherwise normal.  thanks.

## 2015-07-19 ENCOUNTER — Ambulatory Visit
Admission: RE | Admit: 2015-07-19 | Discharge: 2015-07-19 | Disposition: A | Discharge status: Home / Self Care | Payer: 59 | Source: Ambulatory Visit | Attending: Neurology | Admitting: Neurology

## 2015-07-19 DIAGNOSIS — M5416 Radiculopathy, lumbar region: Secondary | ICD-10-CM | POA: Diagnosis not present

## 2015-07-19 DIAGNOSIS — R29898 Other symptoms and signs involving the musculoskeletal system: Secondary | ICD-10-CM

## 2015-07-22 ENCOUNTER — Telehealth: Payer: Self-pay | Admitting: *Deleted

## 2015-07-22 ENCOUNTER — Encounter: Payer: Self-pay | Admitting: Family Medicine

## 2015-07-22 ENCOUNTER — Ambulatory Visit (INDEPENDENT_AMBULATORY_CARE_PROVIDER_SITE_OTHER): Payer: Worker's Compensation | Admitting: Family Medicine

## 2015-07-22 VITALS — BP 120/72 | HR 98 | Temp 98.1°F | Resp 18 | Ht 71.0 in | Wt 240.6 lb

## 2015-07-22 DIAGNOSIS — T148XXA Other injury of unspecified body region, initial encounter: Secondary | ICD-10-CM

## 2015-07-22 DIAGNOSIS — T148 Other injury of unspecified body region: Secondary | ICD-10-CM

## 2015-07-22 NOTE — Patient Instructions (Signed)
     IF you received an x-ray today, you will receive an invoice from [facility]. Please contact [facility] at [phone] with questions or concerns regarding your invoice.   IF you received labwork today, you will receive an invoice from [facility]. Please contact [facility] at [phone] with questions or concerns regarding your invoice.   Our billing staff will not be able to assist you with questions regarding bills from these companies.  You will be contacted with the lab results as soon as they are available. The fastest way to get your results is to activate your My Chart account. Instructions are located on the last page of this paperwork. If you have not heard from us regarding the results in 2 weeks, please contact this office.      

## 2015-07-22 NOTE — Telephone Encounter (Signed)
LVM for pt to call about results. Gave [facility] phone number.  

## 2015-07-22 NOTE — Telephone Encounter (Signed)
-----   Message from Anson FretAntonia B Ahern, MD sent at 07/21/2015  6:42 PM EDT ----- MRI of the lumbar spine did not show any nerve pinching or anything significant in the low back. I do think his muscle pain is overuse/strain as the lumbar spine looks pretty good, nothing significant.

## 2015-07-22 NOTE — Progress Notes (Signed)
Corey FleetSean Serrano is a 35 y.o. male who presents today for   Past Medical History  Diagnosis Date  . Asthma     History  Smoking status  . Former Smoker  . Quit date: 07/15/2014  Smokeless tobacco  . Never Used    Family History  Problem Relation Age of Onset  . Hypertension Mother     Current Outpatient Prescriptions on File Prior to Visit  Medication Sig Dispense Refill  . albuterol (PROVENTIL HFA;VENTOLIN HFA) 108 (90 Base) MCG/ACT inhaler Inhale 2 puffs into the lungs every 6 (six) hours as needed for wheezing or shortness of breath (cough, shortness of breath or wheezing.). 1 Inhaler 1  . cyclobenzaprine (FLEXERIL) 10 MG tablet Take 1 tablet (10 mg total) by mouth 3 (three) times daily as needed for muscle spasms. 90 tablet 3   No current facility-administered medications on file prior to visit.    ROS: Per HPI.  All other systems reviewed and are negative.   Physical Exam Filed Vitals:   07/22/15 1725  BP: 120/72  Pulse: 98  Temp: 98.1 F (36.7 C)  Resp: 18    Physical Examination: General appearance - alert, well appearing, and in no distress Mental status - alert, oriented to person, place, and time Eyes - pupils equal and reactive, extraocular eye movements intact Neurological - alert, oriented, normal speech, no focal findings or movement disorder noted, screening mental status exam normal, neck supple without rigidity, cranial nerves II through XII intact, Romberg sign negative, normal gait and station Skin - superficial 5 mm laceration with scab formation occiput.      Impression/Plan 1) Superficial laceration - very small and UTD with his tetanus as well as too small to close and > 24 hrs.  Recommend F/U PRN.  Screening neuro exam for concussion or neurologic deficit negative as well.  Good for RTW w/o restrictions.

## 2015-07-24 NOTE — Telephone Encounter (Signed)
LVM for pt to call about results. Second message left. Gave [facility] phone number.

## 2015-07-24 NOTE — Telephone Encounter (Signed)
Patient called office back. Relayed results per Dr Lucia GaskinsAhern note. He verbalized understanding. Gave reminder of appt for EMG/NCS on 5/17 check in 12/1215pm.

## 2015-08-06 ENCOUNTER — Ambulatory Visit: Payer: 59 | Admitting: Neurology

## 2015-08-06 NOTE — Progress Notes (Signed)
No show

## 2015-08-11 ENCOUNTER — Emergency Department (HOSPITAL_COMMUNITY)
Admission: EM | Admit: 2015-08-11 | Discharge: 2015-08-11 | Disposition: A | Discharge status: Home / Self Care | Payer: 59 | Attending: Emergency Medicine | Admitting: Emergency Medicine

## 2015-08-11 ENCOUNTER — Emergency Department (HOSPITAL_COMMUNITY): Payer: 59

## 2015-08-11 ENCOUNTER — Encounter (HOSPITAL_COMMUNITY): Payer: Self-pay | Admitting: *Deleted

## 2015-08-11 DIAGNOSIS — Z87891 Personal history of nicotine dependence: Secondary | ICD-10-CM | POA: Diagnosis not present

## 2015-08-11 DIAGNOSIS — N200 Calculus of kidney: Secondary | ICD-10-CM | POA: Diagnosis not present

## 2015-08-11 DIAGNOSIS — Z79899 Other long term (current) drug therapy: Secondary | ICD-10-CM | POA: Diagnosis not present

## 2015-08-11 DIAGNOSIS — J45909 Unspecified asthma, uncomplicated: Secondary | ICD-10-CM | POA: Diagnosis not present

## 2015-08-11 DIAGNOSIS — R109 Unspecified abdominal pain: Secondary | ICD-10-CM | POA: Diagnosis present

## 2015-08-11 DIAGNOSIS — K769 Liver disease, unspecified: Secondary | ICD-10-CM | POA: Insufficient documentation

## 2015-08-11 DIAGNOSIS — Z880 Allergy status to penicillin: Secondary | ICD-10-CM | POA: Insufficient documentation

## 2015-08-11 LAB — COMPREHENSIVE METABOLIC PANEL
ALT: 19 U/L (ref 17–63)
ANION GAP: 8 (ref 5–15)
AST: 21 U/L (ref 15–41)
Albumin: 3.9 g/dL (ref 3.5–5.0)
Alkaline Phosphatase: 67 U/L (ref 38–126)
BUN: 22 mg/dL — ABNORMAL HIGH (ref 6–20)
CHLORIDE: 106 mmol/L (ref 101–111)
CO2: 25 mmol/L (ref 22–32)
CREATININE: 1.4 mg/dL — AB (ref 0.61–1.24)
Calcium: 9.3 mg/dL (ref 8.9–10.3)
Glucose, Bld: 115 mg/dL — ABNORMAL HIGH (ref 65–99)
Potassium: 4.3 mmol/L (ref 3.5–5.1)
SODIUM: 139 mmol/L (ref 135–145)
Total Bilirubin: 0.4 mg/dL (ref 0.3–1.2)
Total Protein: 6.6 g/dL (ref 6.5–8.1)

## 2015-08-11 LAB — CBC
HEMATOCRIT: 42.9 % (ref 39.0–52.0)
HEMOGLOBIN: 14.4 g/dL (ref 13.0–17.0)
MCH: 30.2 pg (ref 26.0–34.0)
MCHC: 33.6 g/dL (ref 30.0–36.0)
MCV: 89.9 fL (ref 78.0–100.0)
Platelets: 205 10*3/uL (ref 150–400)
RBC: 4.77 MIL/uL (ref 4.22–5.81)
RDW: 12.5 % (ref 11.5–15.5)
WBC: 9.7 10*3/uL (ref 4.0–10.5)

## 2015-08-11 LAB — URINALYSIS, ROUTINE W REFLEX MICROSCOPIC
BILIRUBIN URINE: NEGATIVE
Glucose, UA: NEGATIVE mg/dL
Hgb urine dipstick: NEGATIVE
Ketones, ur: NEGATIVE mg/dL
Leukocytes, UA: NEGATIVE
NITRITE: NEGATIVE
PH: 6 (ref 5.0–8.0)
Protein, ur: NEGATIVE mg/dL
SPECIFIC GRAVITY, URINE: 1.029 (ref 1.005–1.030)

## 2015-08-11 LAB — LIPASE, BLOOD: LIPASE: 23 U/L (ref 11–51)

## 2015-08-11 MED ORDER — HYDROMORPHONE HCL 1 MG/ML IJ SOLN
1.0000 mg | Freq: Once | INTRAMUSCULAR | Status: AC
  Administered 2015-08-11: 1 mg via INTRAVENOUS
  Filled 2015-08-11: qty 1

## 2015-08-11 MED ORDER — HYDROCODONE-ACETAMINOPHEN 5-325 MG PO TABS: 1.0000 | ORAL_TABLET | Freq: Four times a day (QID) | ORAL | Status: DC | PRN

## 2015-08-11 MED ORDER — HYDROCODONE-ACETAMINOPHEN 5-325 MG PO TABS
2.0000 | ORAL_TABLET | Freq: Once | ORAL | Status: AC
  Administered 2015-08-11: 2 via ORAL
  Filled 2015-08-11: qty 2

## 2015-08-11 MED ORDER — ONDANSETRON HCL 4 MG/2ML IJ SOLN
4.0000 mg | Freq: Once | INTRAMUSCULAR | Status: AC
  Administered 2015-08-11: 4 mg via INTRAVENOUS
  Filled 2015-08-11: qty 2

## 2015-08-11 MED ORDER — KETOROLAC TROMETHAMINE 30 MG/ML IJ SOLN
15.0000 mg | Freq: Once | INTRAMUSCULAR | Status: AC
  Administered 2015-08-11: 15 mg via INTRAVENOUS
  Filled 2015-08-11: qty 1

## 2015-08-11 MED FILL — HYDROCODON-APAP 5-325: 5-325 | 2 days supply | Qty: 15 | Fill #0

## 2015-08-11 NOTE — ED Provider Notes (Signed)
CSN: [id number]     Arrival date & time 08/11/15  4098 History   First MD Initiated Contact with Patient 08/11/15 [phone]     Chief Complaint  Patient presents with  . Abdominal Pain     (Consider location/radiation/quality/duration/timing/severity/associated sxs/prior Treatment) HPI Comments: Patient presents to the ED with a chief complaint of left flank pain that started suddenly at 4 am.  He denies having any pain like this before.  He denies any radiating pain.  He does report associated nausea and vomiting.  He denies any hematuria or dysuria.  There are no modifying factors.  He denies any fever or chills.  The history is provided by the patient. No language interpreter was used.    Past Medical History  Diagnosis Date  . Asthma    Past Surgical History  Procedure Laterality Date  . No past surgeries     Family History  Problem Relation Age of Onset  . Hypertension Mother    Social History  Substance Use Topics  . Smoking status: Former Smoker    Quit date: 07/15/2014  . Smokeless tobacco: Never Used  . Alcohol Use: No    Review of Systems  Constitutional: Negative for fever and chills.  Respiratory: Negative for shortness of breath.   Cardiovascular: Negative for chest pain.  Gastrointestinal: Positive for nausea and vomiting. Negative for diarrhea and constipation.  Genitourinary: Positive for flank pain. Negative for dysuria.  All other systems reviewed and are negative.     Allergies  Shellfish allergy and Penicillins  Home Medications   Prior to Admission medications   Medication Sig Start Date End Date Taking? Authorizing Provider  albuterol (PROVENTIL HFA;VENTOLIN HFA) 108 (90 Base) MCG/ACT inhaler Inhale 2 puffs into the lungs every 6 (six) hours as needed for wheezing or shortness of breath (cough, shortness of breath or wheezing.). Patient not taking: Reported on 08/11/2015 06/23/15   Wallis Bamberg, PA-C  cyclobenzaprine (FLEXERIL) 10 MG tablet Take 1  tablet (10 mg total) by mouth 3 (three) times daily as needed for muscle spasms. Patient not taking: Reported on 08/11/2015 07/11/15   Anson Fret, MD   BP 133/95 mmHg  Pulse 95  Temp(Src) 97.9 F (36.6 C) (Oral)  Resp 17  Ht  (1.803 m)  Wt 108.863 kg  BMI 33.49 kg/m2  SpO2 99% Physical Exam  Constitutional: He is oriented to person, place, and time. He appears well-developed and well-nourished.  HENT:  Head: Normocephalic and atraumatic.  Eyes: Conjunctivae and EOM are normal. Pupils are equal, round, and reactive to light. Right eye exhibits no discharge. Left eye exhibits no discharge. No scleral icterus.  Neck: Normal range of motion. Neck supple. No JVD present.  Cardiovascular: Normal rate, regular rhythm and normal heart sounds.  Exam reveals no gallop and no friction rub.   No murmur heard. Pulmonary/Chest: Effort normal and breath sounds normal. No respiratory distress. He has no wheezes. He has no rales. He exhibits no tenderness.  Abdominal: Soft. He exhibits no distension and no mass. There is no tenderness. There is no rebound and no guarding.  No focal abdominal tenderness, no RLQ tenderness or pain at McBurney's point, no RUQ tenderness or Murphy's sign, no left-sided abdominal tenderness, no fluid wave, or signs of peritonitis Left CVA tenderness  Musculoskeletal: Normal range of motion. He exhibits no edema or tenderness.  Neurological: He is alert and oriented to person, place, and time.  Skin: Skin is warm and dry.  Psychiatric: He has  a normal mood and affect. His behavior is normal. Judgment and thought content normal.  Nursing note and vitals reviewed.   ED Course  Procedures (including critical care time) Results for orders placed or performed during the hospital encounter of 08/11/15  Lipase, blood  Result Value Ref Range   Lipase 23 11 - 51 U/L  Comprehensive metabolic panel  Result Value Ref Range   Sodium 139 135 - 145 mmol/L   Potassium 4.3  3.5 - 5.1 mmol/L   Chloride 106 101 - 111 mmol/L   CO2 25 22 - 32 mmol/L   Glucose, Bld 115 (H) 65 - 99 mg/dL   BUN 22 (H) 6 - 20 mg/dL   Creatinine, Ser 9.60 (H) 0.61 - 1.24 mg/dL   Calcium 9.3 8.9 - 45.4 mg/dL   Total Protein 6.6 6.5 - 8.1 g/dL   Albumin 3.9 3.5 - 5.0 g/dL   AST 21 15 - 41 U/L   ALT 19 17 - 63 U/L   Alkaline Phosphatase 67 38 - 126 U/L   Total Bilirubin 0.4 0.3 - 1.2 mg/dL   GFR calc non Af Amer >60 >60 mL/min   GFR calc Af Amer >60 >60 mL/min   Anion gap 8 5 - 15  CBC  Result Value Ref Range   WBC 9.7 4.0 - 10.5 K/uL   RBC 4.77 4.22 - 5.81 MIL/uL   Hemoglobin 14.4 13.0 - 17.0 g/dL   HCT 09.8 11.9 - 14.7 %   MCV 89.9 78.0 - 100.0 fL   MCH 30.2 26.0 - 34.0 pg   MCHC 33.6 30.0 - 36.0 g/dL   RDW 82.9 56.2 - 13.0 %   Platelets 205 150 - 400 K/uL  Urinalysis, Routine w reflex microscopic  Result Value Ref Range   Color, Urine YELLOW YELLOW   APPearance CLEAR CLEAR   Specific Gravity, Urine 1.029 1.005 - 1.030   pH 6.0 5.0 - 8.0   Glucose, UA NEGATIVE NEGATIVE mg/dL   Hgb urine dipstick NEGATIVE NEGATIVE   Bilirubin Urine NEGATIVE NEGATIVE   Ketones, ur NEGATIVE NEGATIVE mg/dL   Protein, ur NEGATIVE NEGATIVE mg/dL   Nitrite NEGATIVE NEGATIVE   Leukocytes, UA NEGATIVE NEGATIVE   Mr Lumbar Spine Wo Contrast  07/21/2015   [facility] [address], [address] [place], [place] [address] [phone] NEUROIMAGING REPORT STUDY DATE: 07/19/2015 PATIENT NAME: Corey Serrano DOB: [birth year] MRN: [id number] EXAM: MRI of the lumbar spine without contrast ORDERING CLINICIAN: Levert Feinstein M.D. PhD CLINICAL HISTORY: 35 year old man with lumbar radiculopathy and right leg weakness. COMPARISON FILMS: None TECHNIQUE: MRI of the lumbar spine was obtained utilizing 4 mm sagittal slices from T11 down to the lower sacrum with T1, T2 and inversion recovery views. In addition 4 mm axial slices from L1-2 down to L5-S1 level were included with T1 and T2 weighted views. CONTRAST:  None IMAGING SITE:  imaging, [address] [place], [place], [place] FINDINGS: On sagittal images, the spine is imaged from T11 to the sacrum.   The conus medullaris and cauda equine appear normal.   The vertebral bodies are normally aligned.  Disc height and hydration is maintained at every level.   The vertebral bodies have normal signal.  The discs and interspaces were further evaluated on axial views from L1 to S1 as follows: L1 - L2:  The disc and interspace appear normal. L2 - L3:  The disc and interspace appear normal. L3 - L4:  The disc and interspace appear normal. L4 - L5:  There is mild disc bulging and mild facet hypertrophy with small joint effusions. The central canal is slightly narrowed but not enough to be considered spinal stenosis. The left neural foramen is mildly narrowed and the lateral recesses are mildly narrowed but not associated with any nerve root impingement. L5 - S1:  There is minimal facet hypertrophy and mild left endplate spurring. The left neural foramen is mildly to moderately narrowed. There does not appear to be any nerve root compression. The right neural foramen and the lateral recesses are not significantly narrowed.   07/21/2015   This MRI of the lumbar spine shows mild degenerative changes at L4-L5 and L5-S1 as detailed above that do not appear to lead to nerve root impingement. INTERPRETING PHYSICIAN: Richard A. Epimenio Foot, MD, PhD Certified in  Neuroimaging by American Society of Neuroimaging   Ct Renal Stone Study  08/11/2015  CLINICAL DATA:  Sudden onset of severe left flank pain. Pain radiates to the lower abdomen. Difficulty urinating. EXAM: CT ABDOMEN AND PELVIS WITHOUT CONTRAST TECHNIQUE: Multidetector CT imaging of the abdomen and pelvis was performed following the standard protocol without IV contrast. COMPARISON:  None. FINDINGS: Lower chest: Mild dependent atelectasis in lungs. No pleural effusions. Hepatobiliary: There is a 2.4 cm slightly low-density lesion  along the anterior right hepatic dome. There is a second slightly low-density round lesion in the right hepatic lobe measures 1.5 cm on sequence 2, image 19. These hepatic lesions are nonspecific and but not consistent with simple cysts based on the Hounsfield units. For instance, the Hounsfield units of the 2.4 cm lesion is 36. Normal appearance of the gallbladder. Pancreas: Normal appearance of the pancreas without inflammation or duct dilatation. Spleen: Normal appearance of spleen without enlargement Adrenals/Urinary Tract: Normal adrenal glands. There is a 0.8 cm nodular structure above the left adrenal gland which is nonspecific. This could represent a small lymph node or a splenule. Normal appearance of the right kidney and urinary bladder. 0.3 cm stone in the distal left ureter causing mild-moderate left hydroureteronephrosis. There is mild perinephric edema, particularly in the left hilar region. No other kidney or ureter stones. Stomach/Bowel: No acute abnormality in the stomach, small bowel or colon. Normal appearance of the appendix. Vascular/Lymphatic: No suspicious lymphadenopathy. Small lymph nodes in the periaortic area. No suspicious vascular findings. Reproductive: Normal appearance of the prostate and seminal vesicles. Other: No free fluid.  No free air. Musculoskeletal: No acute abnormality. IMPRESSION: Mild to moderate left hydroureteronephrosis secondary to a 0.3 cm stone in the distal left ureter. Two nonspecific liver lesions, largest measuring 2.4 cm. Although these lesions are likely benign based on patient's age, recommend further characterization of these lesions with MRI for more definitive evaluation. Electronically Signed   By: Richarda Overlie M.D.   On: 08/11/2015 08:03    I have personally reviewed and evaluated these images and lab results as part of my medical decision-making.    MDM   Final diagnoses:  Kidney stone  Liver lesion    Patient with left flank pain.  No  radiating pain.  No testicle pain.  Strong suspicion for KS.  Will check Ct and treat pain.  CT scan remarkable for a 3 mm stone in left distal ureter. Patient's pain has improved with pain medication. No evidence of UTI. Slight decrease in kidney function, creatinine is 1.4, compared to 1.3. Have instructed patient to follow-up with urology if he does not pass the next 48 hours. He is to return for worsening symptoms. Urine  culture pending.  Patient states pain has improved from 15/10 to 9/10.  Will give small dose of toradol.    11:21 AM Patient is now pain free.  Will DC to home with pain meds and return precautions.  Patient understands and agrees with the plan.  Roxy HorsemanRobert Burnette Valenti, PA-C 08/11/15 1121  Gerhard Munchobert Lockwood, MD 08/11/15 [phone]

## 2015-08-11 NOTE — ED Notes (Signed)
Patient transported to CT 

## 2015-08-11 NOTE — Discharge Instructions (Signed)
You need to follow-up with the urologist in 2 days if you have not passed the stone.  Please strain your urine and take the stone to your primary care doctor or to the urologist.  Return to the ER for new or worsening symptoms.  Kidney Stones Kidney stones (urolithiasis) are deposits that form inside your kidneys. The intense pain is caused by the stone moving through the urinary tract. When the stone moves, the ureter goes into spasm around the stone. The stone is usually passed in the urine.  CAUSES   A disorder that makes certain neck glands produce too much parathyroid hormone (primary hyperparathyroidism).  A buildup of uric acid crystals, similar to gout in your joints.  Narrowing (stricture) of the ureter.  A kidney obstruction present at birth (congenital obstruction).  Previous surgery on the kidney or ureters.  Numerous kidney infections. SYMPTOMS   Feeling sick to your stomach (nauseous).  Throwing up (vomiting).  Blood in the urine (hematuria).  Pain that usually spreads (radiates) to the groin.  Frequency or urgency of urination. DIAGNOSIS   Taking a history and physical exam.  Blood or urine tests.  CT scan.  Occasionally, an examination of the inside of the urinary bladder (cystoscopy) is performed. TREATMENT   Observation.  Increasing your fluid intake.  Extracorporeal shock wave lithotripsy--This is a noninvasive procedure that uses shock waves to break up kidney stones.  Surgery may be needed if you have severe pain or persistent obstruction. There are various surgical procedures. Most of the procedures are performed with the use of small instruments. Only small incisions are needed to accommodate these instruments, so recovery time is minimized. The size, location, and chemical composition are all important variables that will determine the proper choice of action for you. Talk to your health care provider to better understand your situation so that  you will minimize the risk of injury to yourself and your kidney.  HOME CARE INSTRUCTIONS   Drink enough water and fluids to keep your urine clear or pale yellow. This will help you to pass the stone or stone fragments.  Strain all urine through the provided strainer. Keep all particulate matter and stones for your health care provider to see. The stone causing the pain may be as small as a grain of salt. It is very important to use the strainer each and every time you pass your urine. The collection of your stone will allow your health care provider to analyze it and verify that a stone has actually passed. The stone analysis will often identify what you can do to reduce the incidence of recurrences.  Only take over-the-counter or prescription medicines for pain, discomfort, or fever as directed by your health care provider.  Keep all follow-up visits as told by your health care provider. This is important.  Get follow-up X-rays if required. The absence of pain does not always mean that the stone has passed. It may have only stopped moving. If the urine remains completely obstructed, it can cause loss of kidney function or even complete destruction of the kidney. It is your responsibility to make sure X-rays and follow-ups are completed. Ultrasounds of the kidney can show blockages and the status of the kidney. Ultrasounds are not associated with any radiation and can be performed easily in a matter of minutes.  Make changes to your daily diet as told by your health care provider. You may be told to:  Limit the amount of salt that you eat.  Eat 5 or more servings of fruits and vegetables each day.  Limit the amount of meat, poultry, fish, and eggs that you eat.  Collect a 24-hour urine sample as told by your health care provider.You may need to collect another urine sample every 6-12 months. SEEK MEDICAL CARE IF:  You experience pain that is progressive and unresponsive to any pain medicine  you have been prescribed. SEEK IMMEDIATE MEDICAL CARE IF:   Pain cannot be controlled with the prescribed medicine.  You have a fever or shaking chills.  The severity or intensity of pain increases over 18 hours and is not relieved by pain medicine.  You develop a new onset of abdominal pain.  You feel faint or pass out.  You are unable to urinate.   This information is not intended to replace advice given to you by your health care provider. Make sure you discuss any questions you have with your health care provider.   Document Released: 03/08/2005 Document Revised: 11/27/2014 Document Reviewed: 08/09/2012 Elsevier Interactive Patient Education [employer]2016 [employer] Inc.

## 2015-08-11 NOTE — ED Notes (Signed)
C/o severe left sided abd. Pain denies nausea and vomiting no diarrhea

## 2015-08-12 LAB — URINE CULTURE: Culture: NO GROWTH

## 2015-09-10 ENCOUNTER — Encounter: Payer: Self-pay | Admitting: Neurology

## 2015-09-11 ENCOUNTER — Encounter: Payer: Self-pay | Admitting: Neurology

## 2015-11-19 ENCOUNTER — Emergency Department (HOSPITAL_COMMUNITY): Payer: 59

## 2015-11-19 ENCOUNTER — Encounter (HOSPITAL_COMMUNITY): Payer: Self-pay | Admitting: *Deleted

## 2015-11-19 DIAGNOSIS — R0602 Shortness of breath: Secondary | ICD-10-CM | POA: Diagnosis present

## 2015-11-19 DIAGNOSIS — J45901 Unspecified asthma with (acute) exacerbation: Secondary | ICD-10-CM | POA: Diagnosis not present

## 2015-11-19 DIAGNOSIS — F17200 Nicotine dependence, unspecified, uncomplicated: Secondary | ICD-10-CM | POA: Diagnosis not present

## 2015-11-19 LAB — BASIC METABOLIC PANEL
Anion gap: 10 (ref 5–15)
BUN: 12 mg/dL (ref 6–20)
CO2: 24 mmol/L (ref 22–32)
CREATININE: 1.31 mg/dL — AB (ref 0.61–1.24)
Calcium: 9.4 mg/dL (ref 8.9–10.3)
Chloride: 105 mmol/L (ref 101–111)
GFR calc Af Amer: >60 mL/min (ref >60)
Glucose, Bld: 108 mg/dL — ABNORMAL HIGH (ref 65–99)
Potassium: 3.6 mmol/L (ref 3.5–5.1)
SODIUM: 139 mmol/L (ref 135–145)

## 2015-11-19 LAB — I-STAT TROPONIN, ED: Troponin i, poc: 0 ng/mL (ref 0.00–0.08)

## 2015-11-19 LAB — CBC
HEMATOCRIT: 42.9 % (ref 39.0–52.0)
Hemoglobin: 14.5 g/dL (ref 13.0–17.0)
MCH: 30.1 pg (ref 26.0–34.0)
MCHC: 33.8 g/dL (ref 30.0–36.0)
MCV: 89 fL (ref 78.0–100.0)
PLATELETS: 208 10*3/uL (ref 150–400)
RBC: 4.82 MIL/uL (ref 4.22–5.81)
RDW: 12.5 % (ref 11.5–15.5)
WBC: 9.4 10*3/uL (ref 4.0–10.5)

## 2015-11-19 MED ORDER — ALBUTEROL SULFATE (2.5 MG/3ML) 0.083% IN NEBU
INHALATION_SOLUTION | RESPIRATORY_TRACT | Status: AC
  Filled 2015-11-19: qty 6

## 2015-11-19 MED ORDER — ALBUTEROL SULFATE (2.5 MG/3ML) 0.083% IN NEBU
5.0000 mg | INHALATION_SOLUTION | Freq: Once | RESPIRATORY_TRACT | Status: AC
  Administered 2015-11-19: 5 mg via RESPIRATORY_TRACT

## 2015-11-19 NOTE — ED Triage Notes (Signed)
Pt c/o shortness of breath and centralized chest pain onset this afternoon. Hx of asthma, reports sob is different

## 2015-11-20 ENCOUNTER — Emergency Department (HOSPITAL_COMMUNITY)
Admission: EM | Admit: 2015-11-20 | Discharge: 2015-11-20 | Disposition: A | Discharge status: Home / Self Care | Payer: 59 | Attending: Emergency Medicine | Admitting: Emergency Medicine

## 2015-11-20 DIAGNOSIS — J45901 Unspecified asthma with (acute) exacerbation: Secondary | ICD-10-CM

## 2015-11-20 LAB — D-DIMER, QUANTITATIVE: D-Dimer, Quant: <0.27 ug/mL-FEU (ref 0.00–0.50)

## 2015-11-20 LAB — TROPONIN I: Troponin I: <0.03 ng/mL (ref <0.03)

## 2015-11-20 MED ORDER — AZITHROMYCIN 250 MG PO TABS: 250.0000 mg | ORAL_TABLET | Freq: Every day | ORAL | 0 refills | Status: DC

## 2015-11-20 MED ORDER — ALBUTEROL SULFATE (2.5 MG/3ML) 0.083% IN NEBU
2.5000 mg | INHALATION_SOLUTION | Freq: Once | RESPIRATORY_TRACT | Status: AC
  Administered 2015-11-20: 2.5 mg via RESPIRATORY_TRACT
  Filled 2015-11-20: qty 3

## 2015-11-20 MED ORDER — IPRATROPIUM BROMIDE 0.02 % IN SOLN
0.5000 mg | Freq: Once | RESPIRATORY_TRACT | Status: AC
Start: 2015-11-20 — End: 2015-11-20
  Administered 2015-11-20: 0.5 mg via RESPIRATORY_TRACT
  Filled 2015-11-20: qty 2.5

## 2015-11-20 MED ORDER — PREDNISONE 20 MG PO TABS
60.0000 mg | ORAL_TABLET | Freq: Once | ORAL | Status: AC
  Administered 2015-11-20: 60 mg via ORAL
  Filled 2015-11-20: qty 3

## 2015-11-20 MED ORDER — PREDNISONE 10 MG PO TABS: 20.0000 mg | ORAL_TABLET | Freq: Every day | ORAL | 0 refills | Status: DC

## 2015-11-20 MED ORDER — ALBUTEROL SULFATE (2.5 MG/3ML) 0.083% IN NEBU: 2.5000 mg | INHALATION_SOLUTION | Freq: Four times a day (QID) | RESPIRATORY_TRACT | 12 refills | Status: DC | PRN

## 2015-11-20 NOTE — ED Provider Notes (Signed)
[facility]EMER[facility]ENCY DEPT Provider Note   CSN: [id number] Arrival date & time: 11/19/15  2240     History   Chief Complaint Chief Complaint  Patient presents with  . Shortness of Breath    HPI Corey Serrano is a 35 y.o. male.  HPI   Patient with a PMH of asthma comes to the ER complaint shortness of breath for the past 8 months. It has been worse over the past couple of weeks as she's been smoking more cigarettes. He also complains of having history of acid reflux, including from the [place] he thoughthe foods. He states having burning his chest after eating over the past couple of months, he takes Tums on a daily basis and this is not necessarily different. He has noticed that as he smokes more his breathing has become worse. His inhaler works briefly but then worsens shortly after. His significant other recommended he come get checked out tonight. He was given an albuterol treatment at triage and he says this has significantly helped his symptoms. His albuterol neb solution at home is expired.  Past Medical History:  Diagnosis Date  . Asthma     Patient Active Problem List   Diagnosis Date Noted  . Lumbar radiculopathy 07/11/2015  . Muscle pain, lumbar 07/11/2015  . Calf pain 07/11/2015  . Strain of calf muscle 07/11/2015  . Lumbosacral strain 07/11/2015  . Weakness of right leg 07/11/2015  . Snoring 07/11/2015  . Morning headache 07/11/2015  . Somnolence, daytime 07/11/2015    Past Surgical History:  Procedure Laterality Date  . NO PAST SURGERIES       Home Medications    Prior to Admission medications   Medication Sig Start Date End Date Taking? Authorizing Provider  albuterol (PROVENTIL HFA;VENTOLIN HFA) 108 (90 Base) MCG/ACT inhaler Inhale 2 puffs into the lungs every 6 (six) hours as needed for wheezing or shortness of breath (cough, shortness of breath or wheezing.). Patient not taking: Reported on 08/11/2015 06/23/15   Wallis Bamberg, PA-C  azithromycin (ZITHROMAX) 250  MG tablet Take 1 tablet (250 mg total) by mouth daily. Take first 2 tablets together, then 1 every day until finished. 11/20/15   Caydee Talkington Neva Seat, PA-C  cyclobenzaprine (FLEXERIL) 10 MG tablet Take 1 tablet (10 mg total) by mouth 3 (three) times daily as needed for muscle spasms. Patient not taking: Reported on 08/11/2015 07/11/15   Anson Fret, MD  HYDROcodone-acetaminophen (NORCO/VICODIN) 5-325 MG tablet Take 1-2 tablets by mouth every 6 (six) hours as needed. 08/11/15   Roxy Horseman, PA-C  predniSONE (DELTASONE) 10 MG tablet Take 2 tablets (20 mg total) by mouth daily. Prednisone dose pack directions:   5 tabs on day one, 4 tabs on day two, 3 tabs on day three, 2 tabs on day four, 1 tab on day five. Disp # [id number] PA-C 11/20/15   Marlon Pel, PA-C    Family History Family History  Problem Relation Age of Onset  . Hypertension Mother     Social History Social History  Substance Use Topics  . Smoking status: Current Every Day Smoker    Last attempt to quit: 07/15/2014  . Smokeless tobacco: Never Used  . Alcohol use 0.0 oz/week     Allergies   Shellfish allergy and Penicillins   Review of Systems Review of Systems  Review of Systems All other systems negative except as documented in the HPI. All pertinent positives and negatives as reviewed in the HPI.   Physical Exam Updated  Vital Signs BP 117/71   Pulse 68   Temp 98.5 F (36.9 C) (Oral)   Resp 20   Ht 5\' 11"  (1.803 m)   Wt 104.3 kg   SpO2 100%   BMI 32.08 kg/m   Physical Exam  Constitutional: He appears well-developed and well-nourished. No distress.  HENT:  Head: Normocephalic and atraumatic.  Eyes: Pupils are equal, round, and reactive to light.  Neck: Normal range of motion. Neck supple.  Cardiovascular: Normal rate and regular rhythm.   Pulmonary/Chest: Effort normal. No apnea. No respiratory distress. He has no decreased breath sounds. He has wheezes (diffuse and mild wheezing). He has no  rhonchi. He has no rales. He exhibits no mass, no tenderness and no laceration.  Abdominal: Soft. Bowel sounds are normal. There is no tenderness.  Musculoskeletal:  No LE swelling  Neurological: He is alert.  Skin: Skin is warm and dry.  Psychiatric: He has a normal mood and affect. His speech is normal and behavior is normal. Thought content normal.  Nursing note and vitals reviewed.   ED Treatments / Results  Labs (all labs ordered are listed, but only abnormal results are displayed) Labs Reviewed  BASIC METABOLIC PANEL - Abnormal; Notable for the following:       Result Value   Glucose, Bld 108 (*)    Creatinine, Ser 1.31 (*)    All other components within normal limits  CBC  TROPONIN I  D-DIMER, QUANTITATIVE (NOT AT [facility])  Rosezena Sensor, ED    EKG  EKG Interpretation None       Radiology Dg Chest 2 View  Result Date: 11/19/2015 CLINICAL DATA:  Shortness of breath and centralized chest pain since this afternoon. History of asthma. Recent diagnosis of right lung lesion. EXAM: CHEST  2 VIEW COMPARISON:  06/23/2015 FINDINGS: The heart size and mediastinal contours are within normal limits. Both lungs are clear. The visualized skeletal structures are unremarkable. IMPRESSION: No active cardiopulmonary disease. Electronically Signed   By: Burman Nieves M.D.   On: 11/19/2015 23:21    Procedures Procedures (including critical care time)  Medications Ordered in ED Medications  albuterol (PROVENTIL) (2.5 MG/3ML) 0.083% nebulizer solution (not administered)  albuterol (PROVENTIL) (2.5 MG/3ML) 0.083% nebulizer solution 5 mg (5 mg Nebulization Given 11/19/15 2254)  albuterol (PROVENTIL) (2.5 MG/3ML) 0.083% nebulizer solution 2.5 mg (2.5 mg Nebulization Given 11/20/15 0228)  ipratropium (ATROVENT) nebulizer solution 0.5 mg (0.5 mg Nebulization Given 11/20/15 0228)  predniSONE (DELTASONE) tablet 60 mg (60 mg Oral Given 11/20/15 0227)     Initial Impression / Assessment and  Plan / ED Course  I have reviewed the triage vital signs and the nursing notes.  Pertinent labs & imaging results that were available during my care of the patient were reviewed by me and considered in my medical decision making (see chart for details).  Clinical Course   Patient doing better after medications. Will give rx for Prednisone and Azithromycin for home and a refill of albuterol nebulizer solution. Given referral to PCP, smoking cessation information.  Pt is in no distress and well appearing. Negative d-dimer, normal chest xray, two negative troponins. Very low risk for ACS. Advised to stop eating spicy foods as well.  I discussed results, diagnoses and plan with Corey Serrano. They voice there understanding and questions were answered. We discussed follow-up recommendations and return precautions.   Final Clinical Impressions(s) / ED Diagnoses   Final diagnoses:  Asthma exacerbation    New Prescriptions New Prescriptions  AZITHROMYCIN (ZITHROMAX) 250 MG TABLET    Take 1 tablet (250 mg total) by mouth daily. Take first 2 tablets together, then 1 every day until finished.   PREDNISONE (DELTASONE) 10 MG TABLET    Take 2 tablets (20 mg total) by mouth daily. Prednisone dose pack directions:   5 tabs on day one, 4 tabs on day two, 3 tabs on day three, 2 tabs on day four, 1 tab on day five. Disp # [id number]  Nandan Willems, PA-C     Marlon Peliffany Jailah Willis, PA-C 11/20/15 0258    Layla MawKristen N Ward, DO 11/20/15 0500

## 2016-03-03 ENCOUNTER — Encounter (HOSPITAL_COMMUNITY): Payer: Self-pay | Admitting: *Deleted

## 2016-03-03 DIAGNOSIS — F17200 Nicotine dependence, unspecified, uncomplicated: Secondary | ICD-10-CM | POA: Diagnosis not present

## 2016-03-03 DIAGNOSIS — R197 Diarrhea, unspecified: Secondary | ICD-10-CM | POA: Diagnosis not present

## 2016-03-03 DIAGNOSIS — Z79899 Other long term (current) drug therapy: Secondary | ICD-10-CM | POA: Insufficient documentation

## 2016-03-03 DIAGNOSIS — J069 Acute upper respiratory infection, unspecified: Secondary | ICD-10-CM | POA: Diagnosis not present

## 2016-03-03 DIAGNOSIS — J45909 Unspecified asthma, uncomplicated: Secondary | ICD-10-CM | POA: Diagnosis not present

## 2016-03-03 DIAGNOSIS — R112 Nausea with vomiting, unspecified: Secondary | ICD-10-CM | POA: Diagnosis not present

## 2016-03-03 DIAGNOSIS — R0981 Nasal congestion: Secondary | ICD-10-CM | POA: Diagnosis present

## 2016-03-03 LAB — CBC
HCT: 40.7 % (ref 39.0–52.0)
Hemoglobin: 13.9 g/dL (ref 13.0–17.0)
MCH: 30.5 pg (ref 26.0–34.0)
MCHC: 34.2 g/dL (ref 30.0–36.0)
MCV: 89.3 fL (ref 78.0–100.0)
PLATELETS: 176 10*3/uL (ref 150–400)
RBC: 4.56 MIL/uL (ref 4.22–5.81)
RDW: 12.6 % (ref 11.5–15.5)
WBC: 8.9 10*3/uL (ref 4.0–10.5)

## 2016-03-03 NOTE — ED Triage Notes (Signed)
Pt c/o NV, lightheadedness, and dizziness x 2 days. Reports mixing chemicals at work and has been feeling bad since then.

## 2016-03-04 ENCOUNTER — Emergency Department (HOSPITAL_COMMUNITY)
Admission: EM | Admit: 2016-03-04 | Discharge: 2016-03-04 | Disposition: A | Discharge status: Home / Self Care | Payer: 59 | Attending: Emergency Medicine | Admitting: Emergency Medicine

## 2016-03-04 DIAGNOSIS — R112 Nausea with vomiting, unspecified: Secondary | ICD-10-CM

## 2016-03-04 DIAGNOSIS — J069 Acute upper respiratory infection, unspecified: Secondary | ICD-10-CM

## 2016-03-04 DIAGNOSIS — R197 Diarrhea, unspecified: Secondary | ICD-10-CM

## 2016-03-04 LAB — COMPREHENSIVE METABOLIC PANEL
ALBUMIN: 4.1 g/dL (ref 3.5–5.0)
ALK PHOS: 65 U/L (ref 38–126)
ALT: 17 U/L (ref 17–63)
AST: 21 U/L (ref 15–41)
Anion gap: 8 (ref 5–15)
BILIRUBIN TOTAL: 0.8 mg/dL (ref 0.3–1.2)
BUN: 13 mg/dL (ref 6–20)
CO2: 25 mmol/L (ref 22–32)
CREATININE: 1.23 mg/dL (ref 0.61–1.24)
Calcium: 9.1 mg/dL (ref 8.9–10.3)
Chloride: 104 mmol/L (ref 101–111)
GFR calc Af Amer: >60 mL/min (ref >60)
GFR calc non Af Amer: >60 mL/min (ref >60)
GLUCOSE: 145 mg/dL — AB (ref 65–99)
POTASSIUM: 3.6 mmol/L (ref 3.5–5.1)
Sodium: 137 mmol/L (ref 135–145)
TOTAL PROTEIN: 6.7 g/dL (ref 6.5–8.1)

## 2016-03-04 MED ORDER — ACETAMINOPHEN 325 MG PO TABS: 650.0000 mg | ORAL_TABLET | Freq: Four times a day (QID) | ORAL | 0 refills | Status: DC | PRN

## 2016-03-04 MED ORDER — CETIRIZINE HCL 10 MG PO TABS: 10.0000 mg | ORAL_TABLET | Freq: Every day | ORAL | 1 refills | Status: DC

## 2016-03-04 MED ORDER — FLUTICASONE PROPIONATE 50 MCG/ACT NA SUSP: 2.0000 | Freq: Every day | NASAL | 0 refills | Status: DC

## 2016-03-04 NOTE — ED Provider Notes (Signed)
[facility]PT Provider Note   CSN: [id number] Arrival date & time: 03/03/16  2319     History   Chief Complaint Chief Complaint  Patient presents with  . Nausea  . Emesis    HPI Corey Serrano is a 35 y.o. male.  Corey Serrano is a [name] who presents to the ED complaining of cold symptoms for the past 5 days and some nausea and vomiting yesterday. Patient reports 5 days ago he began having sneezing, nasal congestion, postnasal drip and fatigue. No fevers. He reports three days ago he used some Biotone natural fertilizer and this made him feel worse and more fatigued. He reports yesterday he had some nausea and vomiting. He reports vomiting twice. He also reports one loose stool. He reports this has since resolved. No bowel movement today. No nausea or vomiting today. No abdominal pain during this time. He still complains of the upper respiratory symptoms with postnasal drip, nasal congestion and rhinorrhea. No treatments prior to arrival. He denies any coughing or shortness of breath. He denies fevers, syncope, hematemesis, hematochezia, abdominal pain, urinary symptoms, cough, chest pain, shortness of breath, palpitations, room spinning dizziness, or rashes.    The history is provided by the patient. No language interpreter was used.  Emesis   Associated symptoms include diarrhea. Pertinent negatives include no abdominal pain, no chills, no cough, no fever and no headaches.    Past Medical History:  Diagnosis Date  . Asthma     Patient Active Problem List   Diagnosis Date Noted  . Lumbar radiculopathy 07/11/2015  . Muscle pain, lumbar 07/11/2015  . Calf pain 07/11/2015  . Strain of calf muscle 07/11/2015  . Lumbosacral strain 07/11/2015  . Weakness of right leg 07/11/2015  . Snoring 07/11/2015  . Morning headache 07/11/2015  . Somnolence, daytime 07/11/2015    Past Surgical History:  Procedure Laterality Date  . NO PAST SURGERIES         Home  Medications    Prior to Admission medications   Medication Sig Start Date End Date Taking? Authorizing Provider  acetaminophen (TYLENOL) 325 MG tablet Take 2 tablets (650 mg total) by mouth every 6 (six) hours as needed for mild pain or moderate pain. 03/04/16   Everlene Farrier, PA-C  albuterol (PROVENTIL HFA;VENTOLIN HFA) 108 (90 Base) MCG/ACT inhaler Inhale 2 puffs into the lungs every 6 (six) hours as needed for wheezing or shortness of breath (cough, shortness of breath or wheezing.). Patient not taking: Reported on 08/11/2015 06/23/15   Wallis Bamberg, PA-C  albuterol (PROVENTIL) (2.5 MG/3ML) 0.083% nebulizer solution Take 3 mLs (2.5 mg total) by nebulization every 6 (six) hours as needed for wheezing or shortness of breath. 11/20/15   Tiffany Neva Seat, PA-C  azithromycin (ZITHROMAX) 250 MG tablet Take 1 tablet (250 mg total) by mouth daily. Take first 2 tablets together, then 1 every day until finished. 11/20/15   Tiffany Neva Seat, PA-C  cetirizine (ZYRTEC ALLERGY) 10 MG tablet Take 1 tablet (10 mg total) by mouth daily. 03/04/16   Everlene Farrier, PA-C  cyclobenzaprine (FLEXERIL) 10 MG tablet Take 1 tablet (10 mg total) by mouth 3 (three) times daily as needed for muscle spasms. Patient not taking: Reported on 08/11/2015 07/11/15   Anson Fret, MD  fluticasone [facility]) 50 MCG/ACT nasal spray Place 2 sprays into both nostrils daily. 03/04/16   Everlene Farrier, PA-C  HYDROcodone-acetaminophen (NORCO/VICODIN) 5-325 MG tablet Take 1-2 tablets by mouth every 6 (six) hours as needed. 08/11/15  Roxy Horsemanobert Browning, PA-C  predniSONE (DELTASONE) 10 MG tablet Take 2 tablets (20 mg total) by mouth daily. Prednisone dose pack directions:   5 tabs on day one, 4 tabs on day two, 3 tabs on day three, 2 tabs on day four, 1 tab on day five. Disp # [address]  Tiffany Greene, PA-C 11/20/15   Marlon Peliffany Greene, PA-C    Family History Family History  Problem Relation Age of Onset  . Hypertension Mother     Social History Social  History  Substance Use Topics  . Smoking status: Current Every Day Smoker    Last attempt to quit: 07/15/2014  . Smokeless tobacco: Never Used  . Alcohol use 0.0 oz/week     Allergies   Shellfish allergy and Penicillins   Review of Systems Review of Systems  Constitutional: Positive for fatigue. Negative for chills and fever.  HENT: Positive for congestion, postnasal drip and rhinorrhea. Negative for ear pain, mouth sores, sore throat and trouble swallowing.   Eyes: Negative for visual disturbance.  Respiratory: Negative for cough, shortness of breath and wheezing.   Cardiovascular: Negative for chest pain and palpitations.  Gastrointestinal: Positive for diarrhea, nausea and vomiting. Negative for abdominal pain and blood in stool.  Genitourinary: Negative for difficulty urinating, dysuria and hematuria.  Musculoskeletal: Negative for back pain and neck pain.  Skin: Negative for rash.  Neurological: Negative for dizziness, syncope and headaches.     Physical Exam Updated Vital Signs BP 102/62   Pulse 69   Temp 98.3 F (36.8 C) (Oral)   Resp 19   SpO2 95%   Physical Exam  Constitutional: He is oriented to person, place, and time. He appears well-developed and well-nourished. No distress.  Nontoxic appearing.  HENT:  Head: Normocephalic and atraumatic.  Right Ear: External ear normal.  Left Ear: External ear normal.  Mouth/Throat: Oropharynx is clear and moist. No oropharyngeal exudate.  Evidence of postnasal drip. No tonsillar hypertrophy or exudates. Boggy nasal turbinates bilaterally.  Eyes: Conjunctivae are normal. Pupils are equal, round, and reactive to light. Right eye exhibits no discharge. Left eye exhibits no discharge.  Neck: Neck supple. No JVD present. No tracheal deviation present.  Cardiovascular: Normal rate, regular rhythm, normal heart sounds and intact distal pulses.  Exam reveals no gallop and no friction rub.   No murmur heard. Pulmonary/Chest:  Effort normal and breath sounds normal. No stridor. No respiratory distress. He has no wheezes. He has no rales.  Lungs clear to auscultation bilaterally. No increased work of breathing. No rales or rhonchi.  Abdominal: Soft. Bowel sounds are normal. He exhibits no distension. There is no tenderness. There is no rebound and no guarding.  Abdomen is soft and nontender to palpation. Bowel sounds are present. No peritoneal signs.  Musculoskeletal: He exhibits no edema or tenderness.  Patient is spontaneously moving all extremities in a coordinated fashion exhibiting good strength.   Lymphadenopathy:    He has no cervical adenopathy.  Neurological: He is alert and oriented to person, place, and time. Coordination normal.  Skin: Skin is warm and dry. Capillary refill takes less than 2 seconds. No rash noted. He is not diaphoretic. No erythema. No pallor.  Psychiatric: He has a normal mood and affect. His behavior is normal.  Nursing note and vitals reviewed.    ED Treatments / Results  Labs (all labs ordered are listed, but only abnormal results are displayed) Labs Reviewed  COMPREHENSIVE METABOLIC PANEL - Abnormal; Notable for the following:  Result Value   Glucose, Bld 145 (*)    All other components within normal limits  CBC    EKG  EKG Interpretation None       Radiology No results found.  Procedures Procedures (including critical care time)  Medications Ordered in ED Medications - No data to display   Initial Impression / Assessment and Plan / ED Course  I have reviewed the triage vital signs and the nursing notes.  Pertinent labs & imaging results that were available during my care of the patient were reviewed by me and considered in my medical decision making (see chart for details).  Clinical Course    This is a [name] who presents to the ED complaining of cold symptoms for the past 5 days and some nausea and vomiting yesterday. Patient reports 5 days  ago he began having sneezing, nasal congestion, postnasal drip and fatigue. No fevers. He reports three days ago he used some Biotone natural fertilizer and this made him feel worse and more fatigued. He reports yesterday he had some nausea and vomiting. He reports vomiting twice. He also reports one loose stool. He reports this has since resolved. No bowel movement today. No nausea or vomiting today. No abdominal pain during this time. He still complains of the upper respiratory symptoms with postnasal drip, nasal congestion and rhinorrhea. No treatments prior to arrival. He denies any coughing or shortness of breath. He denies fevers.  On exam patient is afebrile nontoxic appearing. Boggy nasal turbinates bilaterally. Rhinorrhea present. Evidence of postnasal drip. Lungs clear to auscultation bilaterally. Abdomen is soft and nontender to palpation. CMP is remarkable only for a glucose of 145. Creatinine is 1.23 which is an improvement from his most recent blood work. CBC is within normal limits. No leukocytosis. Biotone fertilizer is an all natural bio-active granular plant food with beneficial bacteria, humates and mycorrhizae. I am not concerned about his exposure 3 days ago. I suspect he had an URI prior to this and this may have exacerbated this. Patient is tolerating by mouth. He's had no nausea or vomiting today. He ate juice and crackers in the emergency department without difficulty. Will discharge with symptomatic treatment. I discussed strict and specific return precautions. I advised the patient to follow-up with their primary care provider this week. I encouraged follow up on his creatine and blood sugar. I advised the patient to return to the emergency department with new or worsening symptoms or new concerns. The patient verbalized understanding and agreement with plan.     Final Clinical Impressions(s) / ED Diagnoses   Final diagnoses:  Viral upper respiratory tract infection  Nausea  vomiting and diarrhea    New Prescriptions New Prescriptions   ACETAMINOPHEN (TYLENOL) 325 MG TABLET    Take 2 tablets (650 mg total) by mouth every 6 (six) hours as needed for mild pain or moderate pain.   CETIRIZINE (ZYRTEC ALLERGY) 10 MG TABLET    Take 1 tablet (10 mg total) by mouth daily.   FLUTICASONE (FLONASE) 50 MCG/ACT NASAL SPRAY    Place 2 sprays into both nostrils daily.     Everlene FarrierWilliam Arihana Ambrocio, PA-C 03/04/16 [id number]    Shon Batonourtney F Horton, MD 03/07/16 2249

## 2016-05-06 ENCOUNTER — Encounter (HOSPITAL_COMMUNITY): Payer: Self-pay

## 2016-05-06 ENCOUNTER — Emergency Department (HOSPITAL_COMMUNITY)
Admission: EM | Admit: 2016-05-06 | Discharge: 2016-05-06 | Disposition: A | Discharge status: Home / Self Care | Payer: Managed Care, Other (non HMO) | Attending: Emergency Medicine | Admitting: Emergency Medicine

## 2016-05-06 DIAGNOSIS — Z79899 Other long term (current) drug therapy: Secondary | ICD-10-CM | POA: Diagnosis not present

## 2016-05-06 DIAGNOSIS — M79662 Pain in left lower leg: Secondary | ICD-10-CM

## 2016-05-06 DIAGNOSIS — S86819A Strain of other muscle(s) and tendon(s) at lower leg level, unspecified leg, initial encounter: Secondary | ICD-10-CM | POA: Diagnosis not present

## 2016-05-06 DIAGNOSIS — Y999 Unspecified external cause status: Secondary | ICD-10-CM | POA: Diagnosis not present

## 2016-05-06 DIAGNOSIS — J45909 Unspecified asthma, uncomplicated: Secondary | ICD-10-CM | POA: Insufficient documentation

## 2016-05-06 DIAGNOSIS — X503XXA Overexertion from repetitive movements, initial encounter: Secondary | ICD-10-CM | POA: Insufficient documentation

## 2016-05-06 DIAGNOSIS — F17200 Nicotine dependence, unspecified, uncomplicated: Secondary | ICD-10-CM | POA: Insufficient documentation

## 2016-05-06 DIAGNOSIS — T148XXA Other injury of unspecified body region, initial encounter: Secondary | ICD-10-CM

## 2016-05-06 DIAGNOSIS — Y929 Unspecified place or not applicable: Secondary | ICD-10-CM | POA: Insufficient documentation

## 2016-05-06 DIAGNOSIS — S8992XA Unspecified injury of left lower leg, initial encounter: Secondary | ICD-10-CM | POA: Diagnosis present

## 2016-05-06 DIAGNOSIS — Y9339 Activity, other involving climbing, rappelling and jumping off: Secondary | ICD-10-CM | POA: Insufficient documentation

## 2016-05-06 LAB — CBC WITH DIFFERENTIAL/PLATELET
Basophils Absolute: 0 10*3/uL (ref 0.0–0.1)
Basophils Relative: 0 %
EOS ABS: 0.2 10*3/uL (ref 0.0–0.7)
EOS PCT: 2 %
HCT: 43.3 % (ref 39.0–52.0)
Hemoglobin: 15 g/dL (ref 13.0–17.0)
LYMPHS ABS: 2.6 10*3/uL (ref 0.7–4.0)
LYMPHS PCT: 33 %
MCH: 30.6 pg (ref 26.0–34.0)
MCHC: 34.6 g/dL (ref 30.0–36.0)
MCV: 88.4 fL (ref 78.0–100.0)
MONO ABS: 0.6 10*3/uL (ref 0.1–1.0)
Monocytes Relative: 7 %
Neutro Abs: 4.7 10*3/uL (ref 1.7–7.7)
Neutrophils Relative %: 58 %
PLATELETS: 198 10*3/uL (ref 150–400)
RBC: 4.9 MIL/uL (ref 4.22–5.81)
RDW: 12.5 % (ref 11.5–15.5)
WBC: 8.1 10*3/uL (ref 4.0–10.5)

## 2016-05-06 LAB — BASIC METABOLIC PANEL
Anion gap: 8 (ref 5–15)
BUN: 10 mg/dL (ref 6–20)
CALCIUM: 9.5 mg/dL (ref 8.9–10.3)
CO2: 25 mmol/L (ref 22–32)
CREATININE: 1.2 mg/dL (ref 0.61–1.24)
Chloride: 107 mmol/L (ref 101–111)
GFR calc Af Amer: >60 mL/min (ref >60)
GLUCOSE: 113 mg/dL — AB (ref 65–99)
Potassium: 4.3 mmol/L (ref 3.5–5.1)
SODIUM: 140 mmol/L (ref 135–145)

## 2016-05-06 MED ORDER — IBUPROFEN 600 MG PO TABS: 600.0000 mg | ORAL_TABLET | Freq: Four times a day (QID) | ORAL | 0 refills | Status: DC | PRN

## 2016-05-06 NOTE — ED Notes (Signed)
Pt states he understands instructions. Home stable with steady gait. 

## 2016-05-06 NOTE — Discharge Instructions (Signed)
We suspect that your symptoms are from muscle injury / soft tissue injury. Please take motrin for the next 5 days. See your doctor in 1 week if the pain is persistent. Return to the ER immediately if the leg swells up or you get shortness of breath or chest pain.

## 2016-05-06 NOTE — ED Provider Notes (Signed)
[facility]-EMERGENCY DEPT Provider Note   CSN: [id number] Arrival date & time: 05/06/16  1114  By signing my name below, I, Corey Serrano, attest that this documentation has been prepared under the direction and in the presence of Corey Kaplan, MD . Electronically Signed: Freida Serrano, Scribe. 05/06/2016. 1:33 PM.   History   Chief Complaint Chief Complaint  Patient presents with  . Leg Pain     The history is provided by the patient. No language interpreter was used.     HPI Comments:  Corey Serrano is a 36 y.o. male who presents to the Emergency Department complaining of gradually worsening, left leg pain since yesterday. He rates his pain a 8/10.  Pt reports jumping in and out of his truck multiple times yesterday and states he feels like he pulled something in the back of his leg. He reports increased pain when he extends the left leg. No alleviating factors noted; no treatment tried.  He denies personal and immediate FHx of PE/DVT. Pt drove to [place] ~ 2 weeks ago. 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    Past Medical History:  Diagnosis Date  . Asthma     Patient Active Problem List   Diagnosis Date Noted  . Lumbar radiculopathy 07/11/2015  . Muscle pain, lumbar 07/11/2015  . Calf pain 07/11/2015  . Strain of calf muscle 07/11/2015  . Lumbosacral strain 07/11/2015  . Weakness of right leg 07/11/2015  . Snoring 07/11/2015  . Morning headache 07/11/2015  . Somnolence, daytime 07/11/2015    Past Surgical History:  Procedure Laterality Date  . NO PAST SURGERIES         Home Medications    Prior to Admission medications   Medication Sig Start Date End Date Taking? Authorizing Provider  acetaminophen (TYLENOL) 325 MG tablet Take 2 tablets (650 mg total) by mouth every 6 (six) hours as needed for mild pain or moderate pain. 03/04/16   Corey Farrier, PA-C  albuterol (PROVENTIL HFA;VENTOLIN HFA) 108 (90 Base) MCG/ACT inhaler Inhale 2 puffs into the  lungs every 6 (six) hours as needed for wheezing or shortness of breath (cough, shortness of breath or wheezing.). Patient not taking: Reported on 08/11/2015 06/23/15   Corey Bamberg, PA-C  albuterol (PROVENTIL) (2.5 MG/3ML) 0.083% nebulizer solution Take 3 mLs (2.5 mg total) by nebulization every 6 (six) hours as needed for wheezing or shortness of breath. 11/20/15   Corey Neva Seat, PA-C  azithromycin (ZITHROMAX) 250 MG tablet Take 1 tablet (250 mg total) by mouth daily. Take first 2 tablets together, then 1 every day until finished. 11/20/15   Corey Neva Seat, PA-C  cetirizine (ZYRTEC ALLERGY) 10 MG tablet Take 1 tablet (10 mg total) by mouth daily. 03/04/16   Corey Farrier, PA-C  cyclobenzaprine (FLEXERIL) 10 MG tablet Take 1 tablet (10 mg total) by mouth 3 (three) times daily as needed for muscle spasms. Patient not taking: Reported on 08/11/2015 07/11/15   Corey Fret, MD  fluticasone [facility]) 50 MCG/ACT nasal spray Place 2 sprays into both nostrils daily. 03/04/16   Corey Farrier, PA-C  HYDROcodone-acetaminophen (NORCO/VICODIN) 5-325 MG tablet Take 1-2 tablets by mouth every 6 (six) hours as needed. 08/11/15   Corey Horseman, PA-C  ibuprofen (ADVIL,MOTRIN) 600 MG tablet Take 1 tablet (600 mg total) by mouth every 6 (six) hours as needed. 05/06/16   Corey Kaplan, MD  predniSONE (DELTASONE) 10 MG tablet Take 2 tablets (20 mg total) by mouth daily. Prednisone dose pack directions:  5 tabs on day one, 4 tabs on day two, 3 tabs on day three, 2 tabs on day four, 1 tab on day five. Disp # [address]15  Corey Greene, PA-C 11/20/15   Corey Serrano Greene, PA-C    Family History Family History  Problem Relation Age of Onset  . Hypertension Mother     Social History Social History  Substance Use Topics  . Smoking status: Current Every Day Smoker    Last attempt to quit: 07/15/2014  . Smokeless tobacco: Never Used  . Alcohol use 0.0 oz/week     Allergies   Shellfish allergy and Penicillins   Review of  Systems Review of Systems  Respiratory: Negative for shortness of breath.   Cardiovascular: Negative for chest pain and leg swelling.  Musculoskeletal: Positive for myalgias.  Neurological: Negative for weakness.     Physical Exam Updated Vital Signs BP 117/76 (BP Location: Left Arm)   Pulse 95   Temp 98.7 F (37.1 C) (Oral)   Resp 18   Ht 5\' 11"  (1.803 m)   Wt 230 lb (104.3 kg)   SpO2 98%   BMI 32.08 kg/m   Physical Exam  Constitutional: He is oriented to person, place, and time. He appears well-developed and well-nourished. No distress.  HENT:  Head: Normocephalic and atraumatic.  Eyes: Conjunctivae are normal.  Cardiovascular: Normal rate.   Pulmonary/Chest: Effort normal.  Abdominal: He exhibits no distension.  Musculoskeletal:  No unilateral swelling of lower extremities  Positive calf tenderness in the LLE  Tenderness worse with dorsiflexion of the foot and knee extention  Neurological: He is alert and oriented to person, place, and time.  Skin: Skin is warm and dry.  Psychiatric: He has a normal mood and affect.  Nursing note and vitals reviewed.    ED Treatments / Results  DIAGNOSTIC STUDIES:  Oxygen Saturation is 98% on RA, normal by my interpretation.    COORDINATION OF CARE:  12:31 PM Discussed treatment plan with pt at bedside and pt agreed to plan.  Labs (all labs ordered are listed, but only abnormal results are displayed) Labs Reviewed  BASIC METABOLIC PANEL - Abnormal; Notable for the following:       Result Value   Glucose, Bld 113 (*)    All other components within normal limits  CBC WITH DIFFERENTIAL/PLATELET    EKG  EKG Interpretation None       Radiology No results found.  Procedures Procedures (including critical care time)  Medications Ordered in ED Medications - No data to display   Initial Impression / Assessment and Plan / ED Course  I have reviewed the triage vital signs and the nursing notes.  Pertinent labs &  imaging results that were available during my care of the patient were reviewed by me and considered in my medical decision making (see chart for details).     Pt comes in with cc of leg pain. Pt has no hx of PE, DVT and denies any exogenous hormone (testosterone / estrogen) use, long distance travels or surgery in the past 6 weeks, active cancer, recent immobilization. Pt has been jumping on and off his truck and thinks he might have landed awkwardly yday.   We discussed conservative approach of treating as RICE and seeing doctor if the pain persists, vs. Aggressive option of getting dimer and ruling out DVT - and pt prefers the former. I think that is very reasonable given the pretest probability for DVT is very low and pt has a  pcp.   Final Clinical Impressions(s) / ED Diagnoses   Final diagnoses:  Pain of left calf  Muscle strain    New Prescriptions New Prescriptions   IBUPROFEN (ADVIL,MOTRIN) 600 MG TABLET    Take 1 tablet (600 mg total) by mouth every 6 (six) hours as needed.   I personally performed the services described in this documentation, which was scribed in my presence. The recorded information has been reviewed and is accurate.     Corey Kaplan, MD 05/06/16 [phone]

## 2016-05-06 NOTE — ED Triage Notes (Signed)
Pt presents with onset of pain to back of L leg since yesterday.  Pt denies specific injury, reports pain radiates into L calf, reports swelling to leg.  Pt denies any shortness of breath.

## 2017-01-04 ENCOUNTER — Emergency Department (HOSPITAL_COMMUNITY)
Admission: EM | Admit: 2017-01-04 | Discharge: 2017-01-04 | Disposition: A | Discharge status: Home / Self Care | Payer: Managed Care, Other (non HMO) | Attending: Emergency Medicine | Admitting: Emergency Medicine

## 2017-01-04 ENCOUNTER — Encounter (HOSPITAL_COMMUNITY): Payer: Self-pay | Admitting: *Deleted

## 2017-01-04 DIAGNOSIS — Z79899 Other long term (current) drug therapy: Secondary | ICD-10-CM | POA: Insufficient documentation

## 2017-01-04 DIAGNOSIS — Z23 Encounter for immunization: Secondary | ICD-10-CM | POA: Diagnosis not present

## 2017-01-04 DIAGNOSIS — Y929 Unspecified place or not applicable: Secondary | ICD-10-CM | POA: Diagnosis not present

## 2017-01-04 DIAGNOSIS — W450XXA Nail entering through skin, initial encounter: Secondary | ICD-10-CM | POA: Insufficient documentation

## 2017-01-04 DIAGNOSIS — S9032XA Contusion of left foot, initial encounter: Secondary | ICD-10-CM | POA: Diagnosis not present

## 2017-01-04 DIAGNOSIS — F17200 Nicotine dependence, unspecified, uncomplicated: Secondary | ICD-10-CM | POA: Insufficient documentation

## 2017-01-04 DIAGNOSIS — Y990 Civilian activity done for income or pay: Secondary | ICD-10-CM | POA: Insufficient documentation

## 2017-01-04 DIAGNOSIS — S99922A Unspecified injury of left foot, initial encounter: Secondary | ICD-10-CM | POA: Diagnosis present

## 2017-01-04 DIAGNOSIS — Y9301 Activity, walking, marching and hiking: Secondary | ICD-10-CM | POA: Diagnosis not present

## 2017-01-04 DIAGNOSIS — J45909 Unspecified asthma, uncomplicated: Secondary | ICD-10-CM | POA: Diagnosis not present

## 2017-01-04 MED ORDER — NAPROXEN 500 MG PO TABS
500.0000 mg | ORAL_TABLET | Freq: Two times a day (BID) | ORAL | 0 refills | Status: DC
Start: 2017-01-04 — End: 2020-06-26

## 2017-01-04 MED ORDER — TETANUS-DIPHTH-ACELL PERTUSSIS 5-2.5-18.5 LF-MCG/0.5 IM SUSP
0.5000 mL | Freq: Once | INTRAMUSCULAR | Status: AC
  Administered 2017-01-04: 0.5 mL via INTRAMUSCULAR
  Filled 2017-01-04: qty 0.5

## 2017-01-04 NOTE — ED Notes (Signed)
See EDP note for complete assessment.

## 2017-01-04 NOTE — ED Provider Notes (Signed)
[facility] EMERGENCY DEPARTMENT Provider Note   CSN: [id number] Arrival date & time: 01/04/17  1802     History   Chief Complaint Chief Complaint  Patient presents with  . Foot Pain    HPI Corey Serrano is a 36 y.o. male.  HPI  Patient presents to ED for evaluation of left foot pain after stepping on a nail yesterday at work. He states that the nail went into his foot and he was able to remove it. He has been able twice since he incident but states pain worse with weightbearing in the area. He is not taking any medications to help with pain. He denies any other injury, fevers, numbness and foot. He is unsure of last tetanus shot.  Past Medical History:  Diagnosis Date  . Asthma     Patient Active Problem List   Diagnosis Date Noted  . Lumbar radiculopathy 07/11/2015  . Muscle pain, lumbar 07/11/2015  . Calf pain 07/11/2015  . Strain of calf muscle 07/11/2015  . Lumbosacral strain 07/11/2015  . Weakness of right leg 07/11/2015  . Snoring 07/11/2015  . Morning headache 07/11/2015  . Somnolence, daytime 07/11/2015    Past Surgical History:  Procedure Laterality Date  . NO PAST SURGERIES         Home Medications    Prior to Admission medications   Medication Sig Start Date End Date Taking? Authorizing Provider  acetaminophen (TYLENOL) 325 MG tablet Take 2 tablets (650 mg total) by mouth every 6 (six) hours as needed for mild pain or moderate pain. 03/04/16   Everlene Farrier, PA-C  albuterol (PROVENTIL HFA;VENTOLIN HFA) 108 (90 Base) MCG/ACT inhaler Inhale 2 puffs into the lungs every 6 (six) hours as needed for wheezing or shortness of breath (cough, shortness of breath or wheezing.). Patient not taking: Reported on 08/11/2015 06/23/15   Wallis Bamberg, PA-C  albuterol (PROVENTIL) (2.5 MG/3ML) 0.083% nebulizer solution Take 3 mLs (2.5 mg total) by nebulization every 6 (six) hours as needed for wheezing or shortness of breath. 11/20/15   Marlon Pel,  PA-C  azithromycin (ZITHROMAX) 250 MG tablet Take 1 tablet (250 mg total) by mouth daily. Take first 2 tablets together, then 1 every day until finished. 11/20/15   Marlon Pel, PA-C  cetirizine (ZYRTEC ALLERGY) 10 MG tablet Take 1 tablet (10 mg total) by mouth daily. 03/04/16   Everlene Farrier, PA-C  cyclobenzaprine (FLEXERIL) 10 MG tablet Take 1 tablet (10 mg total) by mouth 3 (three) times daily as needed for muscle spasms. Patient not taking: Reported on 08/11/2015 07/11/15   Anson Fret, MD  fluticasone [facility]) 50 MCG/ACT nasal spray Place 2 sprays into both nostrils daily. 03/04/16   Everlene Farrier, PA-C  HYDROcodone-acetaminophen (NORCO/VICODIN) 5-325 MG tablet Take 1-2 tablets by mouth every 6 (six) hours as needed. 08/11/15   Roxy Horseman, PA-C  ibuprofen (ADVIL,MOTRIN) 600 MG tablet Take 1 tablet (600 mg total) by mouth every 6 (six) hours as needed. 05/06/16   Derwood Kaplan, MD  naproxen (NAPROSYN) 500 MG tablet Take 1 tablet (500 mg total) by mouth 2 (two) times daily. 01/04/17   Gordon Carlson, PA-C  predniSONE (DELTASONE) 10 MG tablet Take 2 tablets (20 mg total) by mouth daily. Prednisone dose pack directions:   5 tabs on day one, 4 tabs on day two, 3 tabs on day three, 2 tabs on day four, 1 tab on day five. Disp # [address] PA-C 11/20/15   Marlon Pel, PA-C  Family History Family History  Problem Relation Age of Onset  . Hypertension Mother     Social History Social History  Substance Use Topics  . Smoking status: Current Every Day Smoker    Last attempt to quit: 07/15/2014  . Smokeless tobacco: Never Used  . Alcohol use 0.0 oz/week     Allergies   Shellfish allergy and Penicillins   Review of Systems Review of Systems  Constitutional: Negative for chills and fever.  Musculoskeletal: Positive for arthralgias and gait problem. Negative for joint swelling and myalgias.  Skin: Positive for wound. Negative for rash.  Neurological: Negative  for weakness and numbness.     Physical Exam Updated Vital Signs BP 129/82 (BP Location: Left Arm)   Pulse 67   Temp 98.2 F (36.8 C) (Oral)   Resp 16   Ht  (1.803 m)   Wt 104.3 kg (230 lb)   SpO2 99%   BMI 32.08 kg/m   Physical Exam  Constitutional: He appears well-developed and well-nourished. No distress.  HENT:  Head: Normocephalic and atraumatic.  Eyes: Conjunctivae and EOM are normal. No scleral icterus.  Neck: Normal range of motion.  Pulmonary/Chest: Effort normal. No respiratory distress.  Neurological: He is alert.  Skin: No rash noted. He is not diaphoretic.  No visible puncture wound present on the bottom of left foot. No erythema or edema noted.  Psychiatric: He has a normal mood and affect.  Nursing note and vitals reviewed.    ED Treatments / Results  Labs (all labs ordered are listed, but only abnormal results are displayed) Labs Reviewed - No data to display  EKG  EKG Interpretation None       Radiology No results found.  Procedures Procedures (including critical care time)  Medications Ordered in ED Medications  Tdap (BOOSTRIX) injection 0.5 mL (not administered)     Initial Impression / Assessment and Plan / ED Course  I have reviewed the triage vital signs and the nursing notes.  Pertinent labs & imaging results that were available during my care of the patient were reviewed by me and considered in my medical decision making (see chart for details).     Patient presents to ED for evaluation of left foot pain after puncture wound that occurred yesterday. He states that he was at work when [employer] on a nail. States that the nail was inserted into the foot and had to remove it. On my physical exam there is no puncture wound present. However due to patient's concern will give updated tetanus here in the ED. There is no signs of infection. It is ambulatory here in the ED although he reports pain worse with weightbearing. Will give  naproxen to be taken as needed for pain and Ace wrap in the area. Patient appears stable for discharge at this time. Strict return precautions given.  Final Clinical Impressions(s) / ED Diagnoses   Final diagnoses:  Contusion of left foot, initial encounter    New Prescriptions New Prescriptions   NAPROXEN (NAPROSYN) 500 MG TABLET    Take 1 tablet (500 mg total) by mouth 2 (two) times daily.     Dietrich Pates, PA-C 01/04/17 2159    Linwood Dibbles, MD 01/04/17 2308

## 2017-01-04 NOTE — Discharge Instructions (Signed)
Please read attached information regarding your condition. Take naproxen twice daily as needed for pain. Apply Ace wrap as directed. Return to ED for worsening pain, increased swelling, additional injury, redness of the joint, fevers.

## 2017-01-04 NOTE — ED Triage Notes (Signed)
To ED for eval after stepping on a nail last pm. Pain when walking but no open area to bottom of foot. No redness

## 2017-04-06 ENCOUNTER — Emergency Department (HOSPITAL_COMMUNITY)
Admission: EM | Admit: 2017-04-06 | Discharge: 2017-04-06 | Disposition: A | Discharge status: Home / Self Care | Payer: Managed Care, Other (non HMO) | Attending: Emergency Medicine | Admitting: Emergency Medicine

## 2017-04-06 ENCOUNTER — Encounter (HOSPITAL_COMMUNITY): Payer: Self-pay

## 2017-04-06 DIAGNOSIS — F17210 Nicotine dependence, cigarettes, uncomplicated: Secondary | ICD-10-CM | POA: Diagnosis not present

## 2017-04-06 DIAGNOSIS — J45909 Unspecified asthma, uncomplicated: Secondary | ICD-10-CM | POA: Diagnosis not present

## 2017-04-06 DIAGNOSIS — J029 Acute pharyngitis, unspecified: Secondary | ICD-10-CM | POA: Diagnosis present

## 2017-04-06 DIAGNOSIS — Z79899 Other long term (current) drug therapy: Secondary | ICD-10-CM | POA: Insufficient documentation

## 2017-04-06 DIAGNOSIS — B349 Viral infection, unspecified: Secondary | ICD-10-CM | POA: Diagnosis not present

## 2017-04-06 LAB — RAPID STREP SCREEN (MED CTR MEBANE ONLY): STREPTOCOCCUS, GROUP A SCREEN (DIRECT): NEGATIVE

## 2017-04-06 MED ORDER — FLUTICASONE PROPIONATE 50 MCG/ACT NA SUSP: 1.0000 | Freq: Every day | NASAL | 2 refills | Status: DC

## 2017-04-06 MED ORDER — BENZONATATE 100 MG PO CAPS: 100.0000 mg | ORAL_CAPSULE | Freq: Three times a day (TID) | ORAL | 0 refills | Status: DC | PRN

## 2017-04-06 MED ORDER — IBUPROFEN 800 MG PO TABS: 800.0000 mg | ORAL_TABLET | Freq: Three times a day (TID) | ORAL | 0 refills | Status: DC

## 2017-04-06 NOTE — ED Provider Notes (Signed)
[facility] Provider Note   CSN: [id number] Arrival date & time: 04/06/17  1254     History   Chief Complaint Chief Complaint  Patient presents with  . Cough    HPI Corey Serrano is a 37 y.o. male with a hx of asthma and tobacco abuse who presents with complaint of URI symptoms x 2 days. Patient states he is having congestion, rhinorrhea, sore throat, and cough. Patient states cough is productive with phlegm that is green. States he has had some post tussive emesis, non bloody, otherwise none. Has had wheezing relieved by albuterol. States he has tried [employer] Powder and Mirant[employer] juice without relief. No alleviating/aggravating factors. Patient states hx of sick contacts at work with similar sxs. Denies chest pain, dyspnea, palpitations, ear pain, fever, or chills.  HPI  Past Medical History:  Diagnosis Date  . Asthma     Patient Active Problem List   Diagnosis Date Noted  . Lumbar radiculopathy 07/11/2015  . Muscle pain, lumbar 07/11/2015  . Calf pain 07/11/2015  . Strain of calf muscle 07/11/2015  . Lumbosacral strain 07/11/2015  . Weakness of right leg 07/11/2015  . Snoring 07/11/2015  . Morning headache 07/11/2015  . Somnolence, daytime 07/11/2015    Past Surgical History:  Procedure Laterality Date  . FOOT SURGERY    . NO PAST SURGERIES         Home Medications    Prior to Admission medications   Medication Sig Start Date End Date Taking? Authorizing Provider  acetaminophen (TYLENOL) 325 MG tablet Take 2 tablets (650 mg total) by mouth every 6 (six) hours as needed for mild pain or moderate pain. 03/04/16   Everlene Farrieransie, William, PA-C  albuterol (PROVENTIL HFA;VENTOLIN HFA) 108 (90 Base) MCG/ACT inhaler Inhale 2 puffs into the lungs every 6 (six) hours as needed for wheezing or shortness of breath (cough, shortness of breath or wheezing.). Patient not taking: Reported on 08/11/2015 06/23/15   Wallis BambergMani, Mario, PA-C  albuterol (PROVENTIL)  (2.5 MG/3ML) 0.083% nebulizer solution Take 3 mLs (2.5 mg total) by nebulization every 6 (six) hours as needed for wheezing or shortness of breath. 11/20/15   Marlon PelGreene, Tiffany, PA-C  azithromycin (ZITHROMAX) 250 MG tablet Take 1 tablet (250 mg total) by mouth daily. Take first 2 tablets together, then 1 every day until finished. 11/20/15   Marlon PelGreene, Tiffany, PA-C  cetirizine (ZYRTEC ALLERGY) 10 MG tablet Take 1 tablet (10 mg total) by mouth daily. 03/04/16   Everlene Farrieransie, William, PA-C  cyclobenzaprine (FLEXERIL) 10 MG tablet Take 1 tablet (10 mg total) by mouth 3 (three) times daily as needed for muscle spasms. Patient not taking: Reported on 08/11/2015 07/11/15   Anson FretAhern, Antonia B, MD  fluticasone [facility](FLONASE) 50 MCG/ACT nasal spray Place 2 sprays into both nostrils daily. 03/04/16   Everlene Farrieransie, William, PA-C  HYDROcodone-acetaminophen (NORCO/VICODIN) 5-325 MG tablet Take 1-2 tablets by mouth every 6 (six) hours as needed. 08/11/15   Roxy HorsemanBrowning, Robert, PA-C  ibuprofen (ADVIL,MOTRIN) 600 MG tablet Take 1 tablet (600 mg total) by mouth every 6 (six) hours as needed. 05/06/16   Derwood KaplanNanavati, Ankit, MD  naproxen (NAPROSYN) 500 MG tablet Take 1 tablet (500 mg total) by mouth 2 (two) times daily. 01/04/17   Khatri, Hina, PA-C  predniSONE (DELTASONE) 10 MG tablet Take 2 tablets (20 mg total) by mouth daily. Prednisone dose pack directions:   5 tabs on day one, 4 tabs on day two, 3 tabs on day three, 2 tabs on day four,  1 tab on day five. Disp # [address] PA-C 11/20/15   Marlon Pel, PA-C    Family History Family History  Problem Relation Age of Onset  . Hypertension Mother     Social History Social History   Tobacco Use  . Smoking status: Current Every Day Smoker    Last attempt to quit: 07/15/2014    Years since quitting: 2.7  . Smokeless tobacco: Never Used  Substance Use Topics  . Alcohol use: Yes    Alcohol/week: 0.0 oz  . Drug use: No     Allergies   Shellfish allergy and  Penicillins   Review of Systems Review of Systems  Constitutional: Negative for chills and fever.  HENT: Positive for congestion, rhinorrhea and sore throat. Negative for ear pain.   Eyes: Negative for pain, discharge, redness, itching and visual disturbance.  Respiratory: Positive for cough and wheezing. Negative for shortness of breath.   Cardiovascular: Negative for chest pain, palpitations and leg swelling.  Gastrointestinal: Positive for vomiting (post tussive only). Negative for blood in stool, diarrhea and nausea.  All other systems reviewed and are negative.  Physical Exam Updated Vital Signs BP (!) 144/88 (BP Location: Left Arm)   Pulse 68   Temp 98 F (36.7 C) (Oral)   Resp 16   Ht 5\' 11"  (1.803 m)   Wt 102.5 kg (226 lb)   SpO2 100%   BMI 31.52 kg/m   Physical Exam  Constitutional: He appears well-developed and well-nourished.  Non-toxic appearance. No distress.  HENT:  Head: Normocephalic and atraumatic.  Right Ear: Tympanic membrane is not perforated, not erythematous, not retracted and not bulging.  Left Ear: Tympanic membrane is not perforated, not erythematous, not retracted and not bulging.  Nose: Mucosal edema and rhinorrhea present.  Mouth/Throat: Uvula is midline and oropharynx is clear and moist. No oropharyngeal exudate or posterior oropharyngeal erythema.  Eyes: Conjunctivae are normal. Right eye exhibits no discharge. Left eye exhibits no discharge.  Cardiovascular: Normal rate and regular rhythm.  No murmur heard. Pulmonary/Chest: Effort normal and breath sounds normal. No accessory muscle usage. No tachypnea. No respiratory distress. He has no wheezes. He has no rhonchi. He has no rales.  Abdominal: Soft. He exhibits no distension. There is no tenderness.  Lymphadenopathy:    He has no cervical adenopathy.  Neurological: He is alert.  Clear speech.   Skin: Skin is warm and dry. No rash noted.  Psychiatric: He has a normal mood and affect. His  behavior is normal.  Nursing note and vitals reviewed.  ED Treatments / Results  Labs (all labs ordered are listed, but only abnormal results are displayed) Labs Reviewed  RAPID STREP SCREEN (NOT AT [facility])  CULTURE, GROUP A STREP [facility])    EKG  EKG Interpretation None       Radiology No results found.  Procedures Procedures (including critical care time)  Medications Ordered in ED Medications - No data to display   Initial Impression / Assessment and Plan / ED Course  I have reviewed the triage vital signs and the nursing notes.  Pertinent labs & imaging results that were available during my care of the patient were reviewed by me and considered in my medical decision making (see chart for details).    Patient presents with symptoms consistent with viral illness. He is nontoxic appearing, vitals WNL other than BP somewhat elevated- no indication of HTN emergency/urgency- instructed need for recheck with PCP. Patient is afebrile and without adventitious  sounds on lung exam, doubt PNA. No wheezing or respiratory distress on exam. Afebrile, no sinus tenderness, doubt sinusitis. Rapid strep negative, culture pending, doubt strep pharyngitis. Suspect viral etiology, will treat supportively with Ibuprofen, Flonase, and Tessalon. Continue prescribed inhaler use. I discussed results, treatment plan, need for PCP follow-up, and return precautions with the patient. Provided opportunity for questions, patient confirmed understanding and is in agreement with plan.   Vitals:   04/06/17 1301 04/06/17 1514  BP: 130/83 (!) 144/88  Pulse: 91 68  Resp: 20 16  Temp: 98 F (36.7 C) 98 F (36.7 C)  SpO2: 100% 100%    Final Clinical Impressions(s) / ED Diagnoses   Final diagnoses:  Viral illness    ED Discharge Orders        Ordered    fluticasone (FLONASE) 50 MCG/ACT nasal spray  Daily     04/06/17 1633    benzonatate (TESSALON) 100 MG capsule  3 times daily PRN     04/06/17 1633     ibuprofen (ADVIL,MOTRIN) 800 MG tablet  3 times daily     04/06/17 1633       Keta Vanvalkenburgh, McAllister R, PA-C 04/06/17 1639    Maia Plan, MD 04/07/17 [phone]

## 2017-04-06 NOTE — Discharge Instructions (Signed)
You were seen in the emergency today and diagnosed with a viral illness.  I have prescribed you multiple medications to treat your symptoms.   -Flonase to be used 1 spray in each nostril daily.  This medication is used to treat your congestion.  -Tessalon can be taken once every 8 hours as needed.  This medication is used to treat your cough.  -Ibuprofen to be taken once every 8 hours as needed for pain.  You will need to follow-up with your primary care provider in 1 week if your symptoms have not improved.  If you do not have a primary care provider one is provided in your discharge instructions.  Return to the emergency department for any new or worsening symptoms including but not limited to persistent fever for 5 days, difficulty breathing, chest pain, or passing out.   Additionally your blood pressure was elevated in the emergency department today please have this rechecked at your follow up appointment in 1 week

## 2017-04-06 NOTE — ED Triage Notes (Signed)
Per Pt, Pt is coming from home with complaints of cough, congestion, and sore throat that started two days ago. Reports people coming to work sick.

## 2017-04-08 LAB — CULTURE, GROUP A STREP (THRC)

## 2017-05-31 ENCOUNTER — Encounter (HOSPITAL_COMMUNITY): Payer: Self-pay

## 2017-05-31 ENCOUNTER — Emergency Department (HOSPITAL_COMMUNITY)
Admission: EM | Admit: 2017-05-31 | Discharge: 2017-05-31 | Disposition: A | Discharge status: Home / Self Care | Payer: Managed Care, Other (non HMO) | Attending: Emergency Medicine | Admitting: Emergency Medicine

## 2017-05-31 ENCOUNTER — Other Ambulatory Visit: Payer: Self-pay

## 2017-05-31 DIAGNOSIS — Z79899 Other long term (current) drug therapy: Secondary | ICD-10-CM | POA: Insufficient documentation

## 2017-05-31 DIAGNOSIS — J45909 Unspecified asthma, uncomplicated: Secondary | ICD-10-CM | POA: Insufficient documentation

## 2017-05-31 DIAGNOSIS — H1033 Unspecified acute conjunctivitis, bilateral: Secondary | ICD-10-CM | POA: Insufficient documentation

## 2017-05-31 DIAGNOSIS — F17210 Nicotine dependence, cigarettes, uncomplicated: Secondary | ICD-10-CM | POA: Diagnosis not present

## 2017-05-31 DIAGNOSIS — H5789 Other specified disorders of eye and adnexa: Secondary | ICD-10-CM | POA: Diagnosis present

## 2017-05-31 MED ORDER — TETRACAINE HCL 0.5 % OP SOLN
2.0000 [drp] | Freq: Once | OPHTHALMIC | Status: AC
  Administered 2017-05-31: 2 [drp] via OPHTHALMIC
  Filled 2017-05-31: qty 4

## 2017-05-31 MED ORDER — ERYTHROMYCIN 5 MG/GM OP OINT: TOPICAL_OINTMENT | OPHTHALMIC | 0 refills | Status: DC

## 2017-05-31 MED ORDER — FLUORESCEIN SODIUM 1 MG OP STRP
1.0000 | ORAL_STRIP | Freq: Once | OPHTHALMIC | Status: AC
  Administered 2017-05-31: 1 via OPHTHALMIC
  Filled 2017-05-31: qty 1

## 2017-05-31 MED FILL — ERYTHROMYCIN 0.5% EYE OINT: 5 | 7 days supply | Qty: 1 | Fill #0

## 2017-05-31 NOTE — ED Provider Notes (Signed)
[facility] EMERGENCY DEPARTMENT Provider Note   CSN: [id number] Arrival date & time: 05/31/17  0909     History   Chief Complaint Chief Complaint  Patient presents with  . Eye Drainage    HPI Corey Serrano is a 37 y.o. male with a hx of tobacco abuse and asthma who presents to the ED with complaint of bilateral eye discharge that started yesterday. Reports discharge started as mucous like, this morning woke up with bilateral eyes crusted shut, R > L. States he is having associated eye redness and pruritus. Eyes have become irritated/painful secondary to scratching, rates this a 4/10 in severity. States mild blurry vision with the discharge. States sxs improved with warm compresses this AM. No other specific alleviating/aggravating factors.  Patient is not a contact lens wearer. Denies fever, chills, or photophobia.   HPI  Past Medical History:  Diagnosis Date  . Asthma     Patient Active Problem List   Diagnosis Date Noted  . Lumbar radiculopathy 07/11/2015  . Muscle pain, lumbar 07/11/2015  . Calf pain 07/11/2015  . Strain of calf muscle 07/11/2015  . Lumbosacral strain 07/11/2015  . Weakness of right leg 07/11/2015  . Snoring 07/11/2015  . Morning headache 07/11/2015  . Somnolence, daytime 07/11/2015    Past Surgical History:  Procedure Laterality Date  . FOOT SURGERY    . NO PAST SURGERIES         Home Medications    Prior to Admission medications   Medication Sig Start Date End Date Taking? Authorizing Provider  acetaminophen (TYLENOL) 325 MG tablet Take 2 tablets (650 mg total) by mouth every 6 (six) hours as needed for mild pain or moderate pain. 03/04/16   Everlene Farrier, PA-C  albuterol (PROVENTIL HFA;VENTOLIN HFA) 108 (90 Base) MCG/ACT inhaler Inhale 2 puffs into the lungs every 6 (six) hours as needed for wheezing or shortness of breath (cough, shortness of breath or wheezing.). Patient not taking: Reported on 08/11/2015 06/23/15   Wallis Bamberg, PA-C  albuterol (PROVENTIL) (2.5 MG/3ML) 0.083% nebulizer solution Take 3 mLs (2.5 mg total) by nebulization every 6 (six) hours as needed for wheezing or shortness of breath. 11/20/15   Marlon Pel, PA-C  azithromycin (ZITHROMAX) 250 MG tablet Take 1 tablet (250 mg total) by mouth daily. Take first 2 tablets together, then 1 every day until finished. 11/20/15   Marlon Pel, PA-C  benzonatate (TESSALON) 100 MG capsule Take 1 capsule (100 mg total) by mouth 3 (three) times daily as needed for cough. 04/06/17   Xiamara Hulet, Lelon Mast R, PA-C  cetirizine (ZYRTEC ALLERGY) 10 MG tablet Take 1 tablet (10 mg total) by mouth daily. 03/04/16   Everlene Farrier, PA-C  fluticasone (FLONASE) 50 MCG/ACT nasal spray Place 1 spray into both nostrils daily. 04/06/17   Laketa Sandoz, Pleas Koch, PA-C  HYDROcodone-acetaminophen (NORCO/VICODIN) 5-325 MG tablet Take 1-2 tablets by mouth every 6 (six) hours as needed. 08/11/15   Roxy Horseman, PA-C  ibuprofen (ADVIL,MOTRIN) 800 MG tablet Take 1 tablet (800 mg total) by mouth 3 (three) times daily. 04/06/17   Staphanie Harbison R, PA-C  naproxen (NAPROSYN) 500 MG tablet Take 1 tablet (500 mg total) by mouth 2 (two) times daily. 01/04/17   Khatri, Hina, PA-C  predniSONE (DELTASONE) 10 MG tablet Take 2 tablets (20 mg total) by mouth daily. Prednisone dose pack directions:   5 tabs on day one, 4 tabs on day two, 3 tabs on day three, 2 tabs on day four, 1 tab  on day five. Disp # [id number], PA-C 11/20/15   Marlon Pel, PA-C    Family History Family History  Problem Relation Age of Onset  . Hypertension Mother     Social History Social History   Tobacco Use  . Smoking status: Current Every Day Smoker    Last attempt to quit: 07/15/2014    Years since quitting: 2.8  . Smokeless tobacco: Never Used  Substance Use Topics  . Alcohol use: No    Alcohol/week: 0.0 oz    Frequency: Never  . Drug use: No     Allergies   Shellfish allergy and  Penicillins   Review of Systems Review of Systems  Constitutional: Negative for chills and fever.  HENT: Negative for congestion.   Eyes: Positive for pain, discharge, redness, itching and visual disturbance. Negative for photophobia.     Physical Exam Updated Vital Signs BP 130/86 (BP Location: Left Arm)   Pulse 75   Temp 97.8 F (36.6 C) (Oral)   Resp 16   Ht 5\' 11"  (1.803 m)   Wt 103.4 kg (228 lb)   SpO2 100%   BMI 31.80 kg/m   Physical Exam  Constitutional: He appears well-developed and well-nourished.  Non-toxic appearance. No distress.  HENT:  Head: Normocephalic and atraumatic.  Right Ear: Tympanic membrane normal.  Left Ear: Tympanic membrane normal.  Nose: Nose normal.  Mouth/Throat: Uvula is midline.  Eyes: EOM are normal. Pupils are equal, round, and reactive to light. Right eye exhibits no discharge. Left eye exhibits no discharge. Right conjunctiva is injected. Right conjunctiva has no hemorrhage. Left conjunctiva is injected. Left conjunctiva has no hemorrhage.  No periorbital swelling or erythema. No proptosis. EOMI and painless.  [place] Lamp Exam: No fluorescein uptake. No dendritic staining. No evidence of corneal ulceration or abrasion. No evidence of hyphema or hypopyon.  Visual Acuity: 20/20 OD,OS and bilaterally.   Neurological: He is alert.  Clear speech.   Psychiatric: He has a normal mood and affect. His behavior is normal. Thought content normal.  Nursing note and vitals reviewed.   ED Treatments / Results  Labs (all labs ordered are listed, but only abnormal results are displayed) Labs Reviewed - No data to display  EKG  EKG Interpretation None      Radiology No results found.  Procedures Procedures (including critical care time)  Medications Ordered in ED Medications  tetracaine (PONTOCAINE) 0.5 % ophthalmic solution 2 drop (not administered)  fluorescein ophthalmic strip 1 strip (not administered)    Initial Impression /  Assessment and Plan / ED Course  I have reviewed the triage vital signs and the nursing notes.  Pertinent labs & imaging results that were available during my care of the patient were reviewed by me and considered in my medical decision making (see chart for details).  Patient presents with H&P consistent with bacterial conjunctivitis. Conjunctival injection noted on exam, patient with reported purulent discharge. There is no fluorescein uptake on exam, no indication of corneal abrasion/ulceration or HSV. Patient is afebrile and without proptosis, entrapment, or consensual photophobia, no periorbital swelling- doubt periorbital or orbital cellulitis.No significant visual acuity deficit. Patient is not a contact lens wearer.  Patient will be given erythromycin ophthalmic.  Personal hygiene and frequent handwashing discussed.  Patient advised to followup with ophthalmologist for reevaluation in several days, given strict return precautions. Patient verbalizes understanding and is agreeable with plan.  Final Clinical Impressions(s) / ED Diagnoses   Final diagnoses:  Acute bacterial  conjunctivitis of both eyes    ED Discharge Orders        Ordered    erythromycin ophthalmic ointment     05/31/17 0958       Cherly Andersonetrucelli, Danaija Eskridge R, PA-C 05/31/17 1026    Jacalyn LefevreHaviland, Julie, MD 05/31/17 1456

## 2017-05-31 NOTE — Discharge Instructions (Signed)
You were seen in the emergency department today and diagnosed with bacterial conjunctivitis.  This is an infection of part of your eye.  We have given you prescription for erythromycin, this is an antibiotic ointment to be applied 5 times per day for the next 7 days.  This antibiotic will treat the infection.  It is important that you have good hand hygiene and limit the amount of times you touch your eyes, be sure to wash your hands each time you touch your eyes.  Follow-up with the ophthalmologist listed in your discharge instructions in 3 days.  Return to the emergency department for any new or worsening symptoms including but not limited to worsening discharge, fever, swelling around your eye, pain with movement of your eye, inability to move your eye in a certain direction, change in your vision, or any other concerns that she may have.

## 2017-05-31 NOTE — ED Triage Notes (Signed)
Pt arrived with c/o eye drainage that started yesterday. He states that he used warm compresses but "the crust kept coming back". Also reports his eyes were crusted over this morning.

## 2017-07-18 IMAGING — CR DG CHEST 2V
2 series · 2 of 2 positions shown · non-contrast
Comparison: July 06, 2015

CLINICAL DATA: Shortness of breath for 2 weeks

EXAM:
CHEST  2 VIEW

[PA]
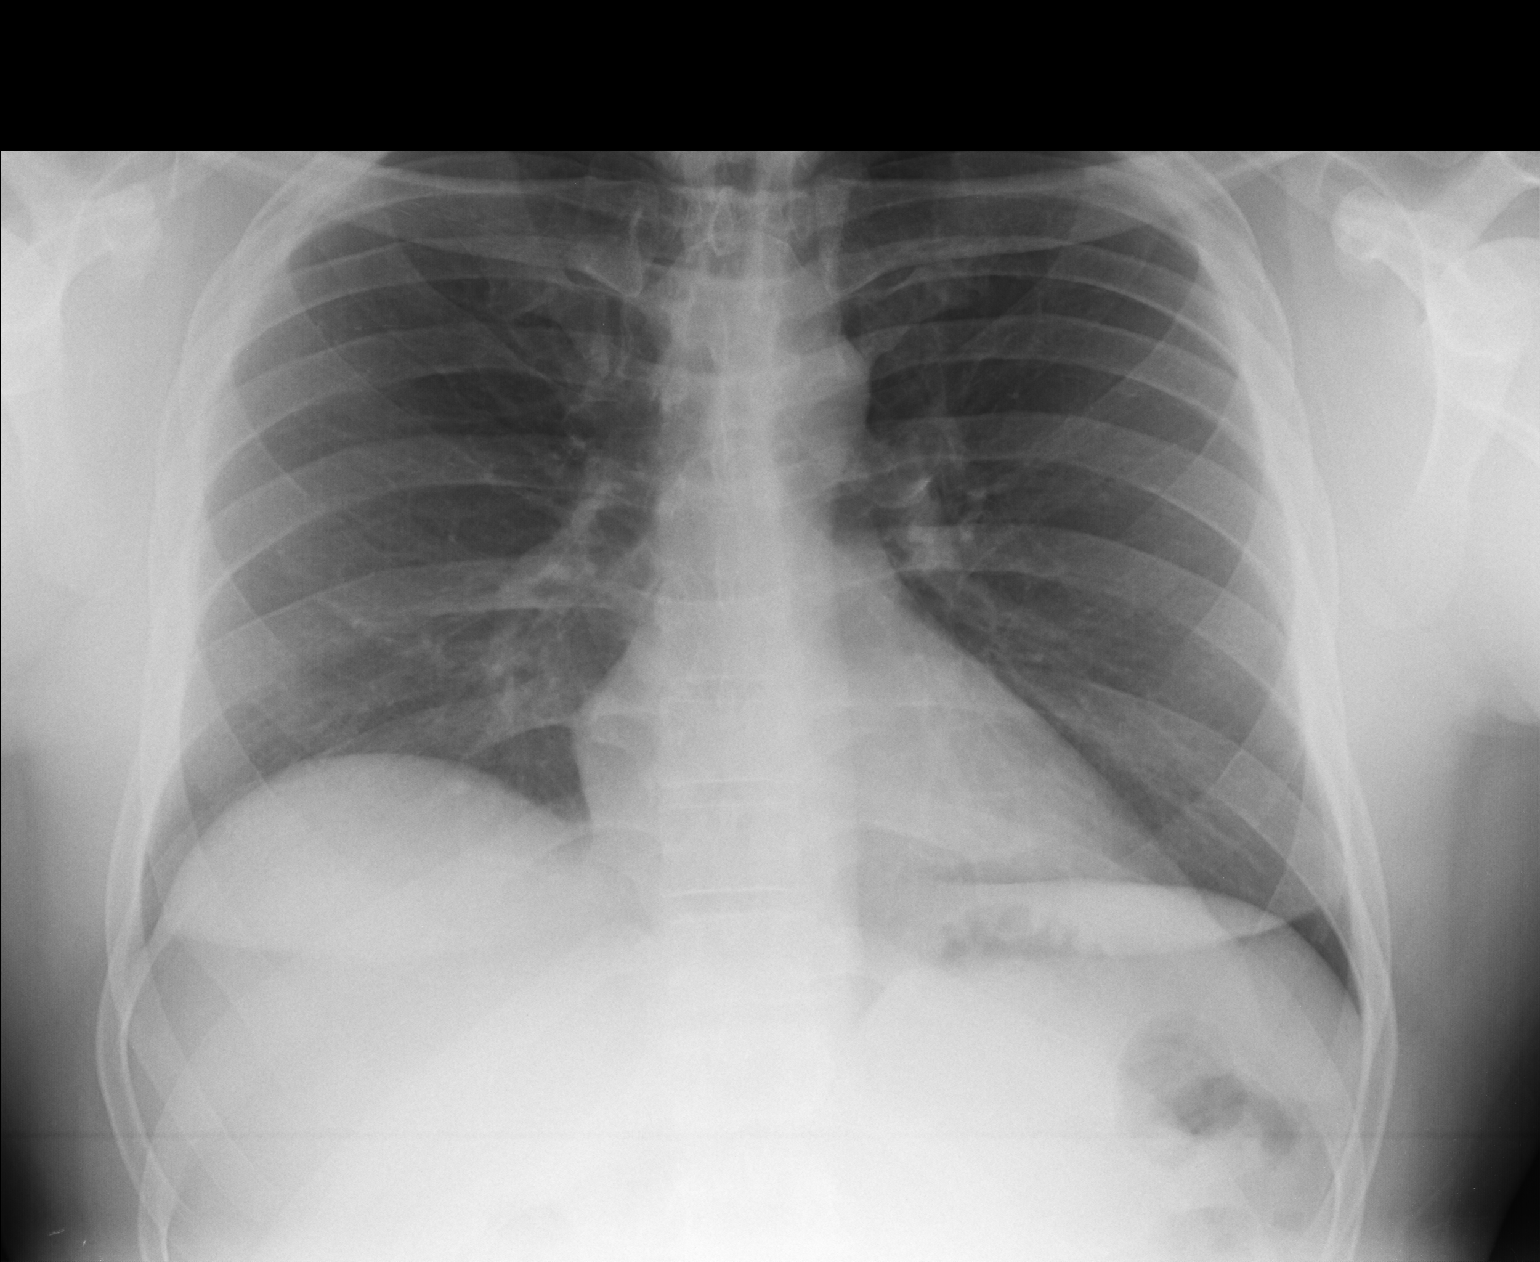

[lateral]
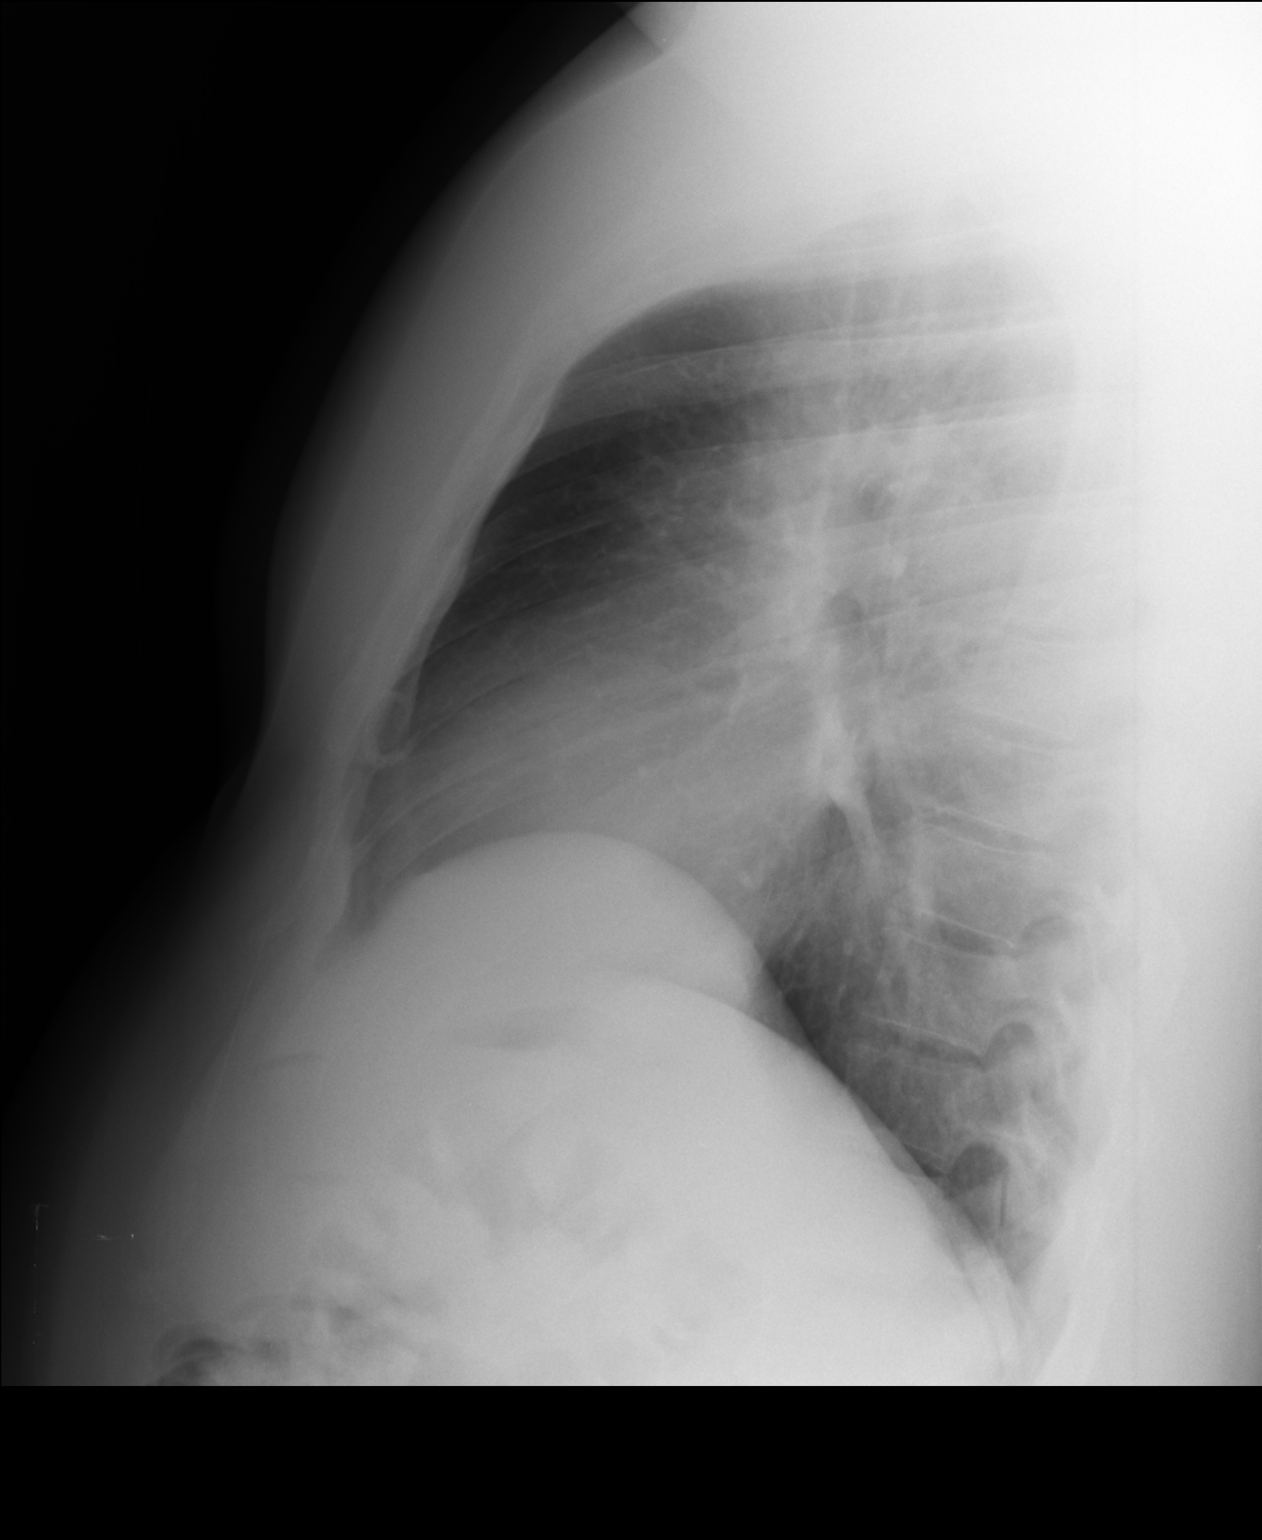

[2 of 2 positions shown; findings below may reference images not displayed]

FINDINGS: There is no edema or consolidation. Heart size and pulmonary
vascularity are normal. No adenopathy. No bone lesions.
IMPRESSION: No edema or consolidation.

## 2017-08-03 ENCOUNTER — Other Ambulatory Visit: Payer: Self-pay

## 2017-08-03 ENCOUNTER — Emergency Department (HOSPITAL_COMMUNITY): Payer: Managed Care, Other (non HMO)

## 2017-08-03 ENCOUNTER — Emergency Department (HOSPITAL_COMMUNITY)
Admission: EM | Admit: 2017-08-03 | Discharge: 2017-08-03 | Disposition: A | Discharge status: Home / Self Care | Payer: Managed Care, Other (non HMO) | Attending: Emergency Medicine | Admitting: Emergency Medicine

## 2017-08-03 ENCOUNTER — Encounter (HOSPITAL_COMMUNITY): Payer: Self-pay

## 2017-08-03 DIAGNOSIS — Z79899 Other long term (current) drug therapy: Secondary | ICD-10-CM | POA: Diagnosis not present

## 2017-08-03 DIAGNOSIS — F17210 Nicotine dependence, cigarettes, uncomplicated: Secondary | ICD-10-CM | POA: Insufficient documentation

## 2017-08-03 DIAGNOSIS — G510 Bell's palsy: Secondary | ICD-10-CM | POA: Insufficient documentation

## 2017-08-03 DIAGNOSIS — R200 Anesthesia of skin: Secondary | ICD-10-CM | POA: Diagnosis present

## 2017-08-03 DIAGNOSIS — J45909 Unspecified asthma, uncomplicated: Secondary | ICD-10-CM | POA: Insufficient documentation

## 2017-08-03 NOTE — ED Triage Notes (Signed)
Pt states for the last several days he has had numbness and heaviness on the left side of his face. Pt also reports frequent headaches. Pt alert and oriented, vitals all within normal limits.

## 2017-08-03 NOTE — ED Provider Notes (Signed)
[facility] EMERGENCY DEPARTMENT Provider Note   CSN: [id number] Arrival date & time: 08/03/17  7829     History   Chief Complaint Chief Complaint  Patient presents with  . Numbness    HPI Corey Serrano is a 37 y.o. male.  HPI Patient presents with a month of left-sided face numbness and heaviness.  States began around a month ago and is been rather constant.  States it feels as if it is numb and also feels as if it is swollen.  States it also feels little weak.  Also gets pain on the left side of his face and head.  No vision changes.  No drooling.  No history of problems like this before.  No recent viral infection. Past Medical History:  Diagnosis Date  . Asthma     Patient Active Problem List   Diagnosis Date Noted  . Lumbar radiculopathy 07/11/2015  . Muscle pain, lumbar 07/11/2015  . Calf pain 07/11/2015  . Strain of calf muscle 07/11/2015  . Lumbosacral strain 07/11/2015  . Weakness of right leg 07/11/2015  . Snoring 07/11/2015  . Morning headache 07/11/2015  . Somnolence, daytime 07/11/2015    Past Surgical History:  Procedure Laterality Date  . FOOT SURGERY    . NO PAST SURGERIES          Home Medications    Prior to Admission medications   Medication Sig Start Date End Date Taking? Authorizing Provider  acetaminophen (TYLENOL) 325 MG tablet Take 2 tablets (650 mg total) by mouth every 6 (six) hours as needed for mild pain or moderate pain. 03/04/16   Everlene Farrier, PA-C  albuterol (PROVENTIL) (2.5 MG/3ML) 0.083% nebulizer solution Take 3 mLs (2.5 mg total) by nebulization every 6 (six) hours as needed for wheezing or shortness of breath. 11/20/15   Marlon Pel, PA-C  azithromycin (ZITHROMAX) 250 MG tablet Take 1 tablet (250 mg total) by mouth daily. Take first 2 tablets together, then 1 every day until finished. 11/20/15   Marlon Pel, PA-C  benzonatate (TESSALON) 100 MG capsule Take 1 capsule (100 mg total) by mouth 3 (three)  times daily as needed for cough. 04/06/17   Petrucelli, Lelon Mast R, PA-C  cetirizine (ZYRTEC ALLERGY) 10 MG tablet Take 1 tablet (10 mg total) by mouth daily. 03/04/16   Everlene Farrier, PA-C  erythromycin ophthalmic ointment Place a 1/2 inch ribbon of ointment into the lower eyelid of each eye 5 times per day for the next 7 days. 05/31/17   Petrucelli, Samantha R, PA-C  fluticasone (FLONASE) 50 MCG/ACT nasal spray Place 1 spray into both nostrils daily. 04/06/17   Petrucelli, Pleas Koch, PA-C  HYDROcodone-acetaminophen (NORCO/VICODIN) 5-325 MG tablet Take 1-2 tablets by mouth every 6 (six) hours as needed. 08/11/15   Roxy Horseman, PA-C  ibuprofen (ADVIL,MOTRIN) 800 MG tablet Take 1 tablet (800 mg total) by mouth 3 (three) times daily. 04/06/17   Petrucelli, Samantha R, PA-C  naproxen (NAPROSYN) 500 MG tablet Take 1 tablet (500 mg total) by mouth 2 (two) times daily. 01/04/17   Khatri, Hina, PA-C  predniSONE (DELTASONE) 10 MG tablet Take 2 tablets (20 mg total) by mouth daily. Prednisone dose pack directions:   5 tabs on day one, 4 tabs on day two, 3 tabs on day three, 2 tabs on day four, 1 tab on day five. Disp # [id number], PA-C 11/20/15   Marlon Pel, PA-C    Family History Family History  Problem Relation Age of Onset  .  Hypertension Mother     Social History Social History   Tobacco Use  . Smoking status: Current Every Day Smoker    Last attempt to quit: 07/15/2014    Years since quitting: 3.0  . Smokeless tobacco: Never Used  Substance Use Topics  . Alcohol use: No    Alcohol/week: 0.0 oz    Frequency: Never  . Drug use: No     Allergies   Shellfish allergy and Penicillins   Review of Systems Review of Systems  Constitutional: Negative for appetite change.  HENT: Negative for congestion and ear pain.   Respiratory: Negative for shortness of breath.   Cardiovascular: Negative for chest pain.  Gastrointestinal: Negative for abdominal pain.  Musculoskeletal:  Negative for back pain.  Skin: Negative for rash.  Neurological: Positive for weakness, numbness and headaches. Negative for speech difficulty.     Physical Exam Updated Vital Signs BP 116/76   Pulse 62   Temp 98.1 F (36.7 C) (Oral)   Resp 18   SpO2 99%   Physical Exam  Constitutional: He appears well-developed.  HENT:  Head: Atraumatic.  Mild left-sided facial droop with forehead involvement.  Eyes: Pupils are equal, round, and reactive to light.  Neck: Neck supple.  Pulmonary/Chest: Effort normal.  Abdominal: Soft.  Musculoskeletal: He exhibits no tenderness.  Neurological: He is alert.  Mild left-sided facial droop.  Paresthesias left upper and lower face.  Forehead involved on facial droop.  No swelling.  Skin: Skin is warm.     ED Treatments / Results  Labs (all labs ordered are listed, but only abnormal results are displayed) Labs Reviewed - No data to display  EKG None  Radiology Ct Head Wo Contrast  Result Date: 08/03/2017 CLINICAL DATA:  Headache for several months. LEFT-sided facial numbness EXAM: CT HEAD WITHOUT CONTRAST TECHNIQUE: Contiguous axial images were obtained from the base of the skull through the vertex without intravenous contrast. COMPARISON:  None. FINDINGS: Brain: No acute intracranial hemorrhage. No focal mass lesion. No CT evidence of acute infarction. No midline shift or mass effect. No hydrocephalus. Basilar cisterns are patent. Vascular: No hyperdense vessel or unexpected calcification. Skull: Normal. Negative for fracture or focal lesion. Sinuses/Orbits: Paranasal sinuses and mastoid air cells are clear. Orbits are clear. Other: None. IMPRESSION: Normal head CT. Electronically Signed   By: Genevive Bi M.D.   On: 08/03/2017 11:27    Procedures Procedures (including critical care time)  Medications Ordered in ED Medications - No data to display   Initial Impression / Assessment and Plan / ED Course  I have reviewed the triage  vital signs and the nursing notes.  Pertinent labs & imaging results that were available during my care of the patient were reviewed by me and considered in my medical decision making (see chart for details).     Patient with left-sided facial droop and numbness.  Mild.  I think this is likely a mild Bell's palsy.  He also has stated that he has change in the sensation of taste on his left tongue.  He is out of the window that likely steroids would help.  However since there is some paresthesia involved with that we will have follow-up with neurology.  Final Clinical Impressions(s) / ED Diagnoses   Final diagnoses:  Bell's palsy    ED Discharge Orders    None       Benjiman Core, MD 08/03/17 1206

## 2017-08-03 NOTE — ED Notes (Signed)
This RN called CT, pt to be picked for CT next

## 2017-10-04 ENCOUNTER — Encounter

## 2017-10-04 ENCOUNTER — Telehealth: Payer: Self-pay | Admitting: Neurology

## 2017-10-04 ENCOUNTER — Ambulatory Visit: Payer: Self-pay | Admitting: Neurology

## 2017-10-04 NOTE — Telephone Encounter (Signed)
Pt was no show to apt today 

## 2017-10-05 ENCOUNTER — Encounter: Payer: Self-pay | Admitting: Neurology

## 2017-10-21 ENCOUNTER — Other Ambulatory Visit: Payer: Self-pay | Admitting: *Deleted

## 2017-10-21 NOTE — Patient Outreach (Addendum)
[facility]) Care Management  10/21/2017  Corey FleetSean Serrano 10-10-1980 [id number]   Subjective: Telephone call to patient's home  / mobile number, no answer, no voicemail set up, and unable to leave a message.    Objective: Per KPN (Knowledge Performance Now, point of care tool) and chart review, patient has had no recent hospitalizations within the last 5 years per Epic/ CHL.    Patient  had 4 ED visits within in the last 6 months.  Patient also has a history of Bell's Palsy.    Assessment: Corey FannyReceived [facility] Consult referral on 10/19/17, needs primary care provider.  [facility] Consult follow up pending patient contact.      Plan: RNCM will send unsuccessful outreach  letter, [facility] pamphlet, will call patient for 2nd telephone outreach attempt, [facility] Consult follow up, and proceed with case closure, within 10 business days if no return call.      Orvetta Danielski H. Gardiner Barefootooper RN, BSN, CCM [facility] Care Management [facility] Telephonic CM Phone: [phone] Fax: [phone]

## 2017-10-25 ENCOUNTER — Other Ambulatory Visit: Payer: Self-pay | Admitting: *Deleted

## 2017-10-25 NOTE — Patient Outreach (Signed)
[facility]) Care Management  10/25/2017  Cleora FleetSean Jagoda 1981/02/10 [id number]   Subjective: Telephone call to patient's home  / mobile number, no answer, no voicemail set up, and unable to leave a message.    Objective: Per KPN (Knowledge Performance Now, point of care tool) and chart review, patient has had no recent hospitalizations within the last 5 years per Epic/ CHL.    Patient  had 4 ED visits within in the last 6 months.  Patient also has a history of Bell's Palsy.    Assessment: Wynelle FannyReceived [facility] Care [facility] Consult referral on 10/19/17, needs primary care provider.  [facility] Consult follow up pending patient contact.      Plan: RNCM has sent unsuccessful outreach  letter, [facility] pamphlet, will call patient for 3rd telephone outreach attempt, [facility] Consult follow up, and proceed with case closure, within 10 business days if no return call.     Lessly Stigler H. Gardiner Barefootooper RN, BSN, CCM [facility] Care Management [facility] Telephonic CM Phone: [phone] Fax: [phone]

## 2017-10-27 ENCOUNTER — Other Ambulatory Visit: Payer: Self-pay | Admitting: *Deleted

## 2017-10-27 NOTE — Patient Outreach (Signed)
[facility]) Care Management  10/27/2017  Corey Serrano Jun 17, 1980 [id number]   Subjective:Telephone call to patient's home / mobile number, no answer, no voicemail set up, and unable to leave a message.    Objective:Per KPN (Knowledge Performance Now, point of care tool) and chart review,patient has had no recent hospitalizations within the last 5 years per Epic/ CHL. Patient had 4 ED visits within in the last 6 months. Patient also has a history of Bell's Palsy.    Assessment:[facility] Consult referral on 10/19/17, needs primary care provider.[facility] Consult follow up pending patient contact.     Plan:RNCM has sent unsuccessful outreach letter, [facility] pamphlet, and will proceed with case closure, within 10 business days if no return call.     Jihan Rudy H. Gardiner Barefootooper RN, BSN, CCM [facility] Care Management [facility] Telephonic CM Phone: [phone] Fax: [phone]

## 2017-11-03 ENCOUNTER — Other Ambulatory Visit: Payer: Self-pay | Admitting: *Deleted

## 2017-11-03 NOTE — Patient Outreach (Signed)
[facility]) Care Management  11/03/2017  Corey FleetSean Yakubov Sep 07, 1980 [id number]   No response from patient outreach attempts will proceed with case closure.    Objective:Per KPN (Knowledge Performance Now, point of care tool) and chart review,patient has had no recent hospitalizations within the last 5 years per Epic/ CHL. Patient had 4 ED visits within in the last 6 months. Patient also has a history of Bell's Palsy.    Assessment:Received [facility] Consult referral on 10/19/17, needs primary care provider.[facility] Consult follow up not completed patient unable to reach and will proceed with case closure.      Plan:Case closure due to unable to reach.      Serafin Decatur H. Gardiner Barefootooper RN, BSN, CCM [facility] Care Management [facility] Telephonic CM Phone: [phone] Fax: [phone]

## 2018-01-09 ENCOUNTER — Encounter (HOSPITAL_COMMUNITY): Payer: Self-pay | Admitting: Emergency Medicine

## 2018-01-09 ENCOUNTER — Emergency Department (HOSPITAL_COMMUNITY): Payer: PRIVATE HEALTH INSURANCE

## 2018-01-09 ENCOUNTER — Emergency Department (HOSPITAL_COMMUNITY)
Admission: EM | Admit: 2018-01-09 | Discharge: 2018-01-09 | Disposition: A | Discharge status: Home / Self Care | Payer: PRIVATE HEALTH INSURANCE | Attending: Emergency Medicine | Admitting: Emergency Medicine

## 2018-01-09 ENCOUNTER — Other Ambulatory Visit: Payer: Self-pay

## 2018-01-09 DIAGNOSIS — R0789 Other chest pain: Secondary | ICD-10-CM | POA: Insufficient documentation

## 2018-01-09 DIAGNOSIS — Z79899 Other long term (current) drug therapy: Secondary | ICD-10-CM | POA: Diagnosis not present

## 2018-01-09 DIAGNOSIS — F17200 Nicotine dependence, unspecified, uncomplicated: Secondary | ICD-10-CM | POA: Insufficient documentation

## 2018-01-09 LAB — BASIC METABOLIC PANEL
Anion gap: 10 (ref 5–15)
BUN: 8 mg/dL (ref 6–20)
CALCIUM: 9.3 mg/dL (ref 8.9–10.3)
CO2: 24 mmol/L (ref 22–32)
CREATININE: 1.17 mg/dL (ref 0.61–1.24)
Chloride: 105 mmol/L (ref 98–111)
GFR calc Af Amer: >60 mL/min (ref >60)
GFR calc non Af Amer: >60 mL/min (ref >60)
GLUCOSE: 114 mg/dL — AB (ref 70–99)
Potassium: 4 mmol/L (ref 3.5–5.1)
Sodium: 139 mmol/L (ref 135–145)

## 2018-01-09 LAB — CBC
HCT: 43.7 % (ref 39.0–52.0)
Hemoglobin: 14.4 g/dL (ref 13.0–17.0)
MCH: 29.9 pg (ref 26.0–34.0)
MCHC: 33 g/dL (ref 30.0–36.0)
MCV: 90.9 fL (ref 80.0–100.0)
PLATELETS: 232 10*3/uL (ref 150–400)
RBC: 4.81 MIL/uL (ref 4.22–5.81)
RDW: 11.8 % (ref 11.5–15.5)
WBC: 8.4 10*3/uL (ref 4.0–10.5)
nRBC: 0 % (ref 0.0–0.2)

## 2018-01-09 LAB — I-STAT TROPONIN, ED: Troponin i, poc: 0 ng/mL (ref 0.00–0.08)

## 2018-01-09 MED ORDER — IBUPROFEN 400 MG PO TABS
600.0000 mg | ORAL_TABLET | Freq: Once | ORAL | Status: AC
  Administered 2018-01-09: 600 mg via ORAL
  Filled 2018-01-09: qty 1

## 2018-01-09 NOTE — ED Notes (Signed)
Patient verbalizes understanding of discharge instructions. Opportunity for questioning and answers were provided. Armband removed by staff, pt discharged from ED.  

## 2018-01-09 NOTE — ED Provider Notes (Signed)
[facility] EMERGENCY DEPARTMENT Provider Note   CSN: [id number] Arrival date & time: 01/09/18  1001     History   Chief Complaint Chief Complaint  Patient presents with  . Chest Pain  . Shortness of Breath    HPI Corey Serrano is a 37 y.o. male.  HPI  37 year old male presents with right-sided chest pain for about a week.  Seem to be worse this morning.  It is worse whenever he moves his arm in any type of direction or takes deep breaths.  It is sharp.  Currently about 8 out of 10.  He has not taken anything for the pain.  It mostly seems to come and go.  No significant cough or fever.  No abdominal pain.  No direct injuries but he does lift heavy objects at work.  No leg swelling or recent travel/surgery.  Past Medical History:  Diagnosis Date  . Asthma     Patient Active Problem List   Diagnosis Date Noted  . Lumbar radiculopathy 07/11/2015  . Muscle pain, lumbar 07/11/2015  . Calf pain 07/11/2015  . Strain of calf muscle 07/11/2015  . Lumbosacral strain 07/11/2015  . Weakness of right leg 07/11/2015  . Snoring 07/11/2015  . Morning headache 07/11/2015  . Somnolence, daytime 07/11/2015    Past Surgical History:  Procedure Laterality Date  . FOOT SURGERY    . NO PAST SURGERIES          Home Medications    Prior to Admission medications   Medication Sig Start Date End Date Taking? Authorizing Provider  OVER THE COUNTER MEDICATION Take 2 tablets by mouth 3 (three) times daily. Muscle tek   Yes [provider]  acetaminophen (TYLENOL) 325 MG tablet Take 2 tablets (650 mg total) by mouth every 6 (six) hours as needed for mild pain or moderate pain. Patient not taking: Reported on 01/09/2018 03/04/16   Everlene Farrier, PA-C  albuterol (PROVENTIL) (2.5 MG/3ML) 0.083% nebulizer solution Take 3 mLs (2.5 mg total) by nebulization every 6 (six) hours as needed for wheezing or shortness of breath. Patient not taking: Reported on 01/09/2018  11/20/15   Marlon Pel, PA-C  azithromycin (ZITHROMAX) 250 MG tablet Take 1 tablet (250 mg total) by mouth daily. Take first 2 tablets together, then 1 every day until finished. Patient not taking: Reported on 01/09/2018 11/20/15   Marlon Pel, PA-C  benzonatate (TESSALON) 100 MG capsule Take 1 capsule (100 mg total) by mouth 3 (three) times daily as needed for cough. Patient not taking: Reported on 01/09/2018 04/06/17   Petrucelli, Pleas Koch, PA-C  cetirizine (ZYRTEC ALLERGY) 10 MG tablet Take 1 tablet (10 mg total) by mouth daily. Patient not taking: Reported on 01/09/2018 03/04/16   Everlene Farrier, PA-C  erythromycin ophthalmic ointment Place a 1/2 inch ribbon of ointment into the lower eyelid of each eye 5 times per day for the next 7 days. Patient not taking: Reported on 01/09/2018 05/31/17   Petrucelli, Lelon Mast R, PA-C  fluticasone (FLONASE) 50 MCG/ACT nasal spray Place 1 spray into both nostrils daily. Patient not taking: Reported on 01/09/2018 04/06/17   Petrucelli, Pleas Koch, PA-C  HYDROcodone-acetaminophen (NORCO/VICODIN) 5-325 MG tablet Take 1-2 tablets by mouth every 6 (six) hours as needed. Patient not taking: Reported on 01/09/2018 08/11/15   Roxy Horseman, PA-C  ibuprofen (ADVIL,MOTRIN) 800 MG tablet Take 1 tablet (800 mg total) by mouth 3 (three) times daily. Patient not taking: Reported on 01/09/2018 04/06/17   Petrucelli, Pleas Koch,  PA-C  naproxen (NAPROSYN) 500 MG tablet Take 1 tablet (500 mg total) by mouth 2 (two) times daily. Patient not taking: Reported on 01/09/2018 01/04/17   Dietrich Pates, PA-C  predniSONE (DELTASONE) 10 MG tablet Take 2 tablets (20 mg total) by mouth daily. Prednisone dose pack directions:   5 tabs on day one, 4 tabs on day two, 3 tabs on day three, 2 tabs on day four, 1 tab on day five. Disp # 15  Marlon Pel, PA-C Patient not taking: Reported on 01/09/2018 11/20/15   Marlon Pel, PA-C    Family History Family History  Problem  Relation Age of Onset  . Hypertension Mother     Social History Social History   Tobacco Use  . Smoking status: Current Every Day Smoker    Last attempt to quit: 07/15/2014    Years since quitting: 3.4  . Smokeless tobacco: Never Used  Substance Use Topics  . Alcohol use: No    Alcohol/week: 0.0 standard drinks    Frequency: Never  . Drug use: No     Allergies   Shellfish allergy and Penicillins   Review of Systems Review of Systems  Respiratory: Negative for shortness of breath.   Cardiovascular: Positive for chest pain. Negative for leg swelling.  Gastrointestinal: Negative for abdominal pain and vomiting.  All other systems reviewed and are negative.    Physical Exam Updated Vital Signs BP (!) 142/84   Pulse 85   Temp 98.8 F (37.1 C) (Oral)   Resp 18   Ht 5\' 11"  (1.803 m)   Wt 106.6 kg   SpO2 100%   BMI 32.78 kg/m   Physical Exam  Constitutional: He appears well-developed and well-nourished.  Non-toxic appearance. He does not appear ill.  HENT:  Head: Normocephalic and atraumatic.  Right Ear: External ear normal.  Left Ear: External ear normal.  Nose: Nose normal.  Eyes: Right eye exhibits no discharge. Left eye exhibits no discharge.  Neck: Neck supple.  Cardiovascular: Normal rate, regular rhythm and normal heart sounds.  Pulmonary/Chest: Effort normal and breath sounds normal. He exhibits tenderness.  No rash or other acute skin abnormalities to anterior chest    Abdominal: Soft. There is no tenderness.  Musculoskeletal: He exhibits no edema.       Right lower leg: He exhibits no edema.       Left lower leg: He exhibits no edema.  Neurological: He is alert.  Skin: Skin is warm and dry.  Psychiatric: His mood appears not anxious.  Nursing note and vitals reviewed.    ED Treatments / Results  Labs (all labs ordered are listed, but only abnormal results are displayed) Labs Reviewed  BASIC METABOLIC PANEL - Abnormal; Notable for the  following components:      Result Value   Glucose, Bld 114 (*)    All other components within normal limits  CBC  I-STAT TROPONIN, ED    EKG EKG Interpretation  Date/Time:  Monday January 09 2018 10:08:01 EDT Ventricular Rate:  86 PR Interval:  136 QRS Duration: 76 QT Interval:  322 QTC Calculation: 385 R Axis:   30 Text Interpretation:  Normal sinus rhythm no acute ST/T changes no significant change since 2017 Confirmed by Pricilla Loveless [phone]) on 01/09/2018 11:24:10 AM   Radiology Dg Chest 2 View  Result Date: 01/09/2018 CLINICAL DATA:  Right sided chest pain EXAM: CHEST - 2 VIEW COMPARISON:  11/19/2015 FINDINGS: The heart size and mediastinal contours are within normal limits. Both  lungs are clear. The visualized skeletal structures are unremarkable. IMPRESSION: No active cardiopulmonary disease. Electronically Signed   By: Elige Ko   On: 01/09/2018 10:48    Procedures Procedures (including critical care time)  Medications Ordered in ED Medications  ibuprofen (ADVIL,MOTRIN) tablet 600 mg (has no administration in time range)     Initial Impression / Assessment and Plan / ED Course  I have reviewed the triage vital signs and the nursing notes.  Pertinent labs & imaging results that were available during my care of the patient were reviewed by me and considered in my medical decision making (see chart for details).     Patient's chest pain is likely chest wall in etiology.  Possibly a muscle strain.  I have advised him to use ibuprofen and/or Tylenol.  While it is pleuritic, his heart rate is normal, no increased work of breathing or shortness of breath.  No hypoxia.  My suspicion for PE is low and he is PERC negative.  I do not think further testing is needed.  I highly doubt ACS or dissection.  Follow-up with his PCP, discussed return precautions.  Final Clinical Impressions(s) / ED Diagnoses   Final diagnoses:  Chest wall pain    ED Discharge Orders     None       Pricilla Loveless, MD 01/09/18 1218

## 2018-01-09 NOTE — ED Triage Notes (Signed)
Pt presents with c/o right chest pain for 1 week but today when he woke up he lifted his are and felt the pain move into his right rib. He reports some shortness of breath. Denies cardiac hx. Pt is a/o at triage with NAD.

## 2018-01-09 NOTE — Discharge Instructions (Signed)
Take ibuprofen and/or Tylenol for pain.  If you develop worsening pain, trouble breathing, or any other new/concerning symptoms and return to the ER for evaluation.

## 2018-02-12 ENCOUNTER — Emergency Department (HOSPITAL_COMMUNITY): Payer: PRIVATE HEALTH INSURANCE

## 2018-02-12 ENCOUNTER — Other Ambulatory Visit: Payer: Self-pay

## 2018-02-12 ENCOUNTER — Emergency Department (HOSPITAL_COMMUNITY)
Admission: EM | Admit: 2018-02-12 | Discharge: 2018-02-12 | Disposition: A | Discharge status: Home / Self Care | Payer: PRIVATE HEALTH INSURANCE | Attending: Emergency Medicine | Admitting: Emergency Medicine

## 2018-02-12 DIAGNOSIS — R69 Illness, unspecified: Secondary | ICD-10-CM

## 2018-02-12 DIAGNOSIS — J111 Influenza due to unidentified influenza virus with other respiratory manifestations: Secondary | ICD-10-CM | POA: Insufficient documentation

## 2018-02-12 DIAGNOSIS — F17200 Nicotine dependence, unspecified, uncomplicated: Secondary | ICD-10-CM | POA: Diagnosis not present

## 2018-02-12 DIAGNOSIS — J45909 Unspecified asthma, uncomplicated: Secondary | ICD-10-CM | POA: Insufficient documentation

## 2018-02-12 DIAGNOSIS — R0981 Nasal congestion: Secondary | ICD-10-CM | POA: Diagnosis present

## 2018-02-12 MED ORDER — ONDANSETRON 4 MG PO TBDP
4.0000 mg | ORAL_TABLET | Freq: Once | ORAL | Status: AC
  Administered 2018-02-12: 4 mg via ORAL
  Filled 2018-02-12: qty 1

## 2018-02-12 MED ORDER — BENZONATATE 100 MG PO CAPS: 100.0000 mg | ORAL_CAPSULE | Freq: Three times a day (TID) | ORAL | 0 refills | Status: DC

## 2018-02-12 MED ORDER — ONDANSETRON 4 MG PO TBDP: ORAL_TABLET | ORAL | 0 refills | Status: DC

## 2018-02-12 MED ORDER — BENZONATATE 100 MG PO CAPS
100.0000 mg | ORAL_CAPSULE | Freq: Once | ORAL | Status: AC
  Administered 2018-02-12: 100 mg via ORAL
  Filled 2018-02-12: qty 1

## 2018-02-12 MED ORDER — IBUPROFEN 400 MG PO TABS
600.0000 mg | ORAL_TABLET | Freq: Once | ORAL | Status: AC
  Administered 2018-02-12: 600 mg via ORAL
  Filled 2018-02-12: qty 1

## 2018-02-12 NOTE — ED Provider Notes (Signed)
[facility] EMERGENCY DEPARTMENT Provider Note   CSN: [id number] Arrival date & time: 02/12/18  1206[facility]     History   Chief Complaint Chief Complaint  Patient presents with  . Fever    HPI Corey Serrano is a 37 y.o. male.  Corey Serrano is a 37 y.o. Male with a history of asthma, who presents to the emergency department for evaluation of nasal congestion, rhinorrhea, sore throat and cough which is intermittently productive.  The symptoms have been present and worsening over the past 3 days.  Symptoms are constant in nature.  He reports fevers yesterday up to 102, afebrile today.  He has not been taking any medications to treat his symptoms but reports he is been trying to drink lots of fluids and rest.  He was sent home from work today due to his symptoms.  He reports history of asthma but he is actually not had any shortness of breath or wheezing he just feels like he is having a hard time breathing through his nose because of all of the congestion.  He reports he had a few episodes of nausea and vomiting but no focal abdominal pain.  No chest pain.  No known sick contacts.  No other aggravating or relieving factors.     Past Medical History:  Diagnosis Date  . Asthma     Patient Active Problem List   Diagnosis Date Noted  . Lumbar radiculopathy 07/11/2015  . Muscle pain, lumbar 07/11/2015  . Calf pain 07/11/2015  . Strain of calf muscle 07/11/2015  . Lumbosacral strain 07/11/2015  . Weakness of right leg 07/11/2015  . Snoring 07/11/2015  . Morning headache 07/11/2015  . Somnolence, daytime 07/11/2015    Past Surgical History:  Procedure Laterality Date  . FOOT SURGERY    . NO PAST SURGERIES          Home Medications    Prior to Admission medications   Medication Sig Start Date End Date Taking? Authorizing Provider  acetaminophen (TYLENOL) 325 MG tablet Take 2 tablets (650 mg total) by mouth every 6 (six) hours as needed for mild pain or moderate  pain. Patient not taking: Reported on 01/09/2018 03/04/16   Everlene Farrieransie, William, PA-C  albuterol (PROVENTIL) (2.5 MG/3ML) 0.083% nebulizer solution Take 3 mLs (2.5 mg total) by nebulization every 6 (six) hours as needed for wheezing or shortness of breath. Patient not taking: Reported on 01/09/2018 11/20/15   Marlon PelGreene, Tiffany, PA-C  azithromycin (ZITHROMAX) 250 MG tablet Take 1 tablet (250 mg total) by mouth daily. Take first 2 tablets together, then 1 every day until finished. Patient not taking: Reported on 01/09/2018 11/20/15   Marlon PelGreene, Tiffany, PA-C  benzonatate (TESSALON) 100 MG capsule Take 1 capsule (100 mg total) by mouth every 8 (eight) hours. 02/12/18   Dartha LodgeFord, Adonys Wildes N, PA-C  cetirizine (ZYRTEC ALLERGY) 10 MG tablet Take 1 tablet (10 mg total) by mouth daily. Patient not taking: Reported on 01/09/2018 03/04/16   Everlene Farrieransie, William, PA-C  erythromycin ophthalmic ointment Place a 1/2 inch ribbon of ointment into the lower eyelid of each eye 5 times per day for the next 7 days. Patient not taking: Reported on 01/09/2018 05/31/17   Petrucelli, Lelon MastSamantha R, PA-C  fluticasone (FLONASE) 50 MCG/ACT nasal spray Place 1 spray into both nostrils daily. Patient not taking: Reported on 01/09/2018 04/06/17   Petrucelli, Pleas KochSamantha R, PA-C  HYDROcodone-acetaminophen (NORCO/VICODIN) 5-325 MG tablet Take 1-2 tablets by mouth every 6 (six) hours as needed. Patient not taking:  Reported on 01/09/2018 08/11/15   Roxy Horseman, PA-C  ibuprofen (ADVIL,MOTRIN) 800 MG tablet Take 1 tablet (800 mg total) by mouth 3 (three) times daily. Patient not taking: Reported on 01/09/2018 04/06/17   Petrucelli, Lelon Mast R, PA-C  naproxen (NAPROSYN) 500 MG tablet Take 1 tablet (500 mg total) by mouth 2 (two) times daily. Patient not taking: Reported on 01/09/2018 01/04/17   Dietrich Pates, PA-C  ondansetron (ZOFRAN ODT) 4 MG disintegrating tablet 4mg  ODT q4 hours prn nausea/vomit 02/12/18   Dartha Lodge, PA-C  OVER THE COUNTER MEDICATION  Take 2 tablets by mouth 3 (three) times daily. Muscle tek    [provider]  predniSONE (DELTASONE) 10 MG tablet Take 2 tablets (20 mg total) by mouth daily. Prednisone dose pack directions:   5 tabs on day one, 4 tabs on day two, 3 tabs on day three, 2 tabs on day four, 1 tab on day five. Disp # 15  Marlon Pel, PA-C Patient not taking: Reported on 01/09/2018 11/20/15   Marlon Pel, PA-C    Family History Family History  Problem Relation Age of Onset  . Hypertension Mother     Social History Social History   Tobacco Use  . Smoking status: Current Every Day Smoker    Last attempt to quit: 07/15/2014    Years since quitting: 3.5  . Smokeless tobacco: Never Used  Substance Use Topics  . Alcohol use: No    Alcohol/week: 0.0 standard drinks    Frequency: Never  . Drug use: No     Allergies   Shellfish allergy and Penicillins   Review of Systems Review of Systems  Constitutional: Positive for chills and fever.  HENT: Positive for congestion, postnasal drip, rhinorrhea, sinus pressure and sore throat. Negative for ear pain, trouble swallowing and voice change.   Eyes: Negative for visual disturbance.  Respiratory: Positive for cough. Negative for chest tightness, shortness of breath and wheezing.   Cardiovascular: Negative for chest pain.  Gastrointestinal: Positive for nausea and vomiting. Negative for abdominal pain.  Genitourinary: Negative for dysuria and frequency.  Musculoskeletal: Positive for myalgias. Negative for arthralgias, neck pain and neck stiffness.  Skin: Negative for color change and rash.  Neurological: Negative for dizziness, light-headedness and headaches.     Physical Exam Updated Vital Signs BP (!) 122/93 (BP Location: Right Arm)   Pulse 99   Temp 98.2 F (36.8 C) (Oral)   Resp 20   Ht 6' (1.829 m)   Wt 106 kg   SpO2 100%   BMI 31.69 kg/m   Physical Exam  Constitutional: He appears well-developed and well-nourished. He  does not appear ill. No distress.  HENT:  Head: Normocephalic and atraumatic.  Mouth/Throat: Oropharynx is clear and moist.  TMs clear with good landmarks, moderate nasal mucosa edema with clear rhinorrhea, posterior oropharynx clear and moist, with some erythema, no edema or exudates, uvula midline  Eyes: Right eye exhibits no discharge. Left eye exhibits no discharge.  Neck: Neck supple.  No rigidity  Cardiovascular: Normal rate, regular rhythm, normal heart sounds and intact distal pulses.  Pulmonary/Chest: Effort normal and breath sounds normal. No respiratory distress.  Respirations equal and unlabored, patient able to speak in full sentences, lungs clear to auscultation bilaterally, occasional cough during exam  Abdominal: Soft. Bowel sounds are normal. He exhibits no distension and no mass. There is no tenderness. There is no guarding.  Abdomen soft, nondistended, nontender to palpation in all quadrants without guarding or peritoneal signs  Musculoskeletal: He exhibits no deformity.  Lymphadenopathy:    He has no cervical adenopathy.  Neurological: He is alert.  Skin: Skin is warm and dry. Capillary refill takes less than 2 seconds. He is not diaphoretic.  Nursing note and vitals reviewed.    ED Treatments / Results  Labs (all labs ordered are listed, but only abnormal results are displayed) Labs Reviewed - No data to display  EKG None  Radiology Dg Chest 2 View  Result Date: 02/12/2018 CLINICAL DATA:  Cough and congestion for 3 days. EXAM: CHEST - 2 VIEW COMPARISON:  01/09/2018 and prior radiographs FINDINGS: The cardiomediastinal silhouette is unremarkable. There is no evidence of focal airspace disease, pulmonary edema, suspicious pulmonary nodule/mass, pleural effusion, or pneumothorax. No acute bony abnormalities are identified. IMPRESSION: No active cardiopulmonary disease. Electronically Signed   By: Harmon Pier M.D.   On: 02/12/2018 13:18    Procedures Procedures  (including critical care time)  Medications Ordered in ED Medications  ibuprofen (ADVIL,MOTRIN) tablet 600 mg (600 mg Oral Given 02/12/18 1235)  ondansetron (ZOFRAN-ODT) disintegrating tablet 4 mg (4 mg Oral Given 02/12/18 1235)  benzonatate (TESSALON) capsule 100 mg (100 mg Oral Given 02/12/18 1235)     Initial Impression / Assessment and Plan / ED Course  I have reviewed the triage vital signs and the nursing notes.  Pertinent labs & imaging results that were available during my care of the patient were reviewed by me and considered in my medical decision making (see chart for details).  Patient with symptoms consistent with influenza.  Vitals are stable,afebrile. No signs of dehydration, tolerating PO's.  Lungs are clear. CXR without signs of pneumonia, or other active cardiopulmonary disease.Discussed the cost versus benefit of Tamiflu treatment with the patient.  The patient understands that symptoms are greater than the recommended 24-48 hour window of treatment, no tamiflu provided.  Patient will be discharged with instructions to orally hydrate, rest, and use over-the-counter medications such as anti-inflammatories ibuprofen and Aleve for muscle aches and Tylenol for fever.  Patient will also be given a cough suppressant and zofran for symptom management.   Final Clinical Impressions(s) / ED Diagnoses   Final diagnoses:  Influenza-like illness    ED Discharge Orders         Ordered    benzonatate (TESSALON) 100 MG capsule  Every 8 hours     02/12/18 1323    ondansetron (ZOFRAN ODT) 4 MG disintegrating tablet     02/12/18 1323           Jodi Geralds [place], [place] 02/12/18 1327    Raeford Razor, MD 02/13/18 1257

## 2018-02-12 NOTE — Discharge Instructions (Signed)
You have the flu this is a viral infection that will likely start to improve after 5-7 days, antibiotics are not helpful in treating viral infections. You may use Zofran as needed for nausea. Since your symptoms have been present for more than 2 days Tamiflu will give no additional benefit.  Please make sure you are drinking plenty of fluids. You can treat your symptoms supportively with tylenol/ibuprofen for fevers and pains, Zyrtec and Flonase to heal with nasal congestion, and prescribed cough medication and throat lozenges to help with cough. If your symptoms are not improving please follow up with you Primary doctor.   If you develop persistent fevers, shortness of breath or difficulty breathing, chest pain, severe headache and neck pain, persistent nausea and vomiting or other new or concerning symptoms return to the Emergency department.

## 2018-02-12 NOTE — ED Notes (Signed)
Declined W/C at D/C and was escorted to lobby by RN. 

## 2018-02-12 NOTE — ED Triage Notes (Signed)
Pt drinking a soda  

## 2018-02-12 NOTE — ED Triage Notes (Signed)
PT reports 3 days for URI  With congestion and fever ,sore throat

## 2018-05-17 ENCOUNTER — Emergency Department (HOSPITAL_COMMUNITY)
Admission: EM | Admit: 2018-05-17 | Discharge: 2018-05-17 | Disposition: A | Discharge status: Home / Self Care | Payer: PRIVATE HEALTH INSURANCE | Attending: Emergency Medicine | Admitting: Emergency Medicine

## 2018-05-17 ENCOUNTER — Other Ambulatory Visit: Payer: Self-pay

## 2018-05-17 ENCOUNTER — Encounter (HOSPITAL_COMMUNITY): Payer: Self-pay | Admitting: Emergency Medicine

## 2018-05-17 DIAGNOSIS — B353 Tinea pedis: Secondary | ICD-10-CM | POA: Diagnosis not present

## 2018-05-17 DIAGNOSIS — L03032 Cellulitis of left toe: Secondary | ICD-10-CM

## 2018-05-17 DIAGNOSIS — M79672 Pain in left foot: Secondary | ICD-10-CM | POA: Diagnosis present

## 2018-05-17 DIAGNOSIS — F17210 Nicotine dependence, cigarettes, uncomplicated: Secondary | ICD-10-CM | POA: Diagnosis not present

## 2018-05-17 MED ORDER — CEPHALEXIN 500 MG PO CAPS: 500.0000 mg | ORAL_CAPSULE | Freq: Two times a day (BID) | ORAL | 0 refills | Status: AC

## 2018-05-17 MED ORDER — CLOTRIMAZOLE 1 % EX CREA: TOPICAL_CREAM | CUTANEOUS | 0 refills | Status: DC

## 2018-05-17 NOTE — ED Triage Notes (Signed)
Pt. Stated, I have a cut or something on my left foot  For the last 2 days. This is something new. The toes all look swollen.

## 2018-05-17 NOTE — ED Notes (Signed)
E-signature not available, verbalized understanding of DC instructions and prescriptions.  

## 2018-05-17 NOTE — ED Provider Notes (Signed)
[facility] EMERGENCY DEPARTMENT Provider Note  CSN: [id number] Arrival date & time: 05/17/18 1830  Chief Complaint(s) Foot Pain  HPI Corey Serrano is a 38 y.o. male   The history is provided by the patient.  Foot Pain  This is a new problem. Episode onset: 2 weeks. The problem occurs constantly. The problem has been rapidly improving (over the last 2 days). Pertinent negatives include no chest pain, no abdominal pain, no headaches and no shortness of breath. The symptoms are aggravated by walking. The symptoms are relieved by rest. Treatments tried: Motrin. The treatment provided moderate relief.   Denies trauma, immobilization.  Past Medical History Past Medical History:  Diagnosis Date  . Asthma    Patient Active Problem List   Diagnosis Date Noted  . Lumbar radiculopathy 07/11/2015  . Muscle pain, lumbar 07/11/2015  . Calf pain 07/11/2015  . Strain of calf muscle 07/11/2015  . Lumbosacral strain 07/11/2015  . Weakness of right leg 07/11/2015  . Snoring 07/11/2015  . Morning headache 07/11/2015  . Somnolence, daytime 07/11/2015   Home Medication(s) Prior to Admission medications   Medication Sig Start Date End Date Taking? Authorizing Provider  acetaminophen (TYLENOL) 325 MG tablet Take 2 tablets (650 mg total) by mouth every 6 (six) hours as needed for mild pain or moderate pain. Patient not taking: Reported on 01/09/2018 03/04/16   Everlene Farrier, PA-C  albuterol (PROVENTIL) (2.5 MG/3ML) 0.083% nebulizer solution Take 3 mLs (2.5 mg total) by nebulization every 6 (six) hours as needed for wheezing or shortness of breath. Patient not taking: Reported on 01/09/2018 11/20/15   Marlon Pel, PA-C  azithromycin (ZITHROMAX) 250 MG tablet Take 1 tablet (250 mg total) by mouth daily. Take first 2 tablets together, then 1 every day until finished. Patient not taking: Reported on 01/09/2018 11/20/15   Marlon Pel, PA-C  benzonatate (TESSALON) 100 MG capsule  Take 1 capsule (100 mg total) by mouth every 8 (eight) hours. 02/12/18   Dartha Lodge, PA-C  cephALEXin (KEFLEX) 500 MG capsule Take 1 capsule (500 mg total) by mouth 2 (two) times daily for 10 days. 05/17/18 05/27/18  Nira Conn, MD  cetirizine (ZYRTEC ALLERGY) 10 MG tablet Take 1 tablet (10 mg total) by mouth daily. Patient not taking: Reported on 01/09/2018 03/04/16   Everlene Farrier, PA-C  clotrimazole (LOTRIMIN) 1 % cream Apply to affected area 2 times daily until infection clears 05/17/18   Cardama, Amadeo Garnet, MD  erythromycin ophthalmic ointment Place a 1/2 inch ribbon of ointment into the lower eyelid of each eye 5 times per day for the next 7 days. Patient not taking: Reported on 01/09/2018 05/31/17   Petrucelli, Lelon Mast R, PA-C  fluticasone (FLONASE) 50 MCG/ACT nasal spray Place 1 spray into both nostrils daily. Patient not taking: Reported on 01/09/2018 04/06/17   Petrucelli, Pleas Koch, PA-C  HYDROcodone-acetaminophen (NORCO/VICODIN) 5-325 MG tablet Take 1-2 tablets by mouth every 6 (six) hours as needed. Patient not taking: Reported on 01/09/2018 08/11/15   Roxy Horseman, PA-C  ibuprofen (ADVIL,MOTRIN) 800 MG tablet Take 1 tablet (800 mg total) by mouth 3 (three) times daily. Patient not taking: Reported on 01/09/2018 04/06/17   Petrucelli, Lelon Mast R, PA-C  naproxen (NAPROSYN) 500 MG tablet Take 1 tablet (500 mg total) by mouth 2 (two) times daily. Patient not taking: Reported on 01/09/2018 01/04/17   Dietrich Pates, PA-C  ondansetron (ZOFRAN ODT) 4 MG disintegrating tablet 4mg  ODT q4 hours prn nausea/vomit 02/12/18   Dartha Lodge,  PA-C  OVER THE COUNTER MEDICATION Take 2 tablets by mouth 3 (three) times daily. Muscle tek    [provider]  predniSONE (DELTASONE) 10 MG tablet Take 2 tablets (20 mg total) by mouth daily. Prednisone dose pack directions:   5 tabs on day one, 4 tabs on day two, 3 tabs on day three, 2 tabs on day four, 1 tab on day five. Disp #  15  Marlon Peliffany Greene, PA-C Patient not taking: Reported on 01/09/2018 11/20/15   Marlon PelGreene, Tiffany, PA-C                                                                                                                                    Past Surgical History Past Surgical History:  Procedure Laterality Date  . FOOT SURGERY    . NO PAST SURGERIES     Family History Family History  Problem Relation Age of Onset  . Hypertension Mother     Social History Social History   Tobacco Use  . Smoking status: Current Every Day Smoker    Last attempt to quit: 07/15/2014    Years since quitting: 3.8  . Smokeless tobacco: Never Used  Substance Use Topics  . Alcohol use: No    Alcohol/week: 0.0 standard drinks    Frequency: Never  . Drug use: No   Allergies Shellfish allergy and Penicillins  Review of Systems Review of Systems  Respiratory: Negative for shortness of breath.   Cardiovascular: Negative for chest pain.  Gastrointestinal: Negative for abdominal pain.  Neurological: Negative for headaches.   All other systems are reviewed and are negative for acute change except as noted in the HPI  Physical Exam Vital Signs  I have reviewed the triage vital signs BP (!) 132/91   Pulse 78   Temp 98.2 F (36.8 C) (Oral)   Resp 16   Ht 5\' 11"  (1.803 m)   Wt 104.3 kg   SpO2 98%   BMI 32.08 kg/m   Physical Exam Vitals signs reviewed.  Constitutional:      General: He is not in acute distress.    Appearance: He is well-developed. He is not diaphoretic.  HENT:     Head: Normocephalic and atraumatic.     Jaw: No trismus.     Right Ear: External ear normal.     Left Ear: External ear normal.     Nose: Nose normal.  Eyes:     General: No scleral icterus.    Conjunctiva/sclera: Conjunctivae normal.  Neck:     Musculoskeletal: Normal range of motion.     Trachea: Phonation normal.  Cardiovascular:     Rate and Rhythm: Normal rate and regular rhythm.     Pulses:          Dorsalis  pedis pulses are 2+ on the right side and 2+ on the left side.     Comments: Tinea unguium Pulmonary:  Effort: Pulmonary effort is normal. No respiratory distress.     Breath sounds: No stridor.  Abdominal:     General: There is no distension.  Musculoskeletal: Normal range of motion.       Feet:  Neurological:     Mental Status: He is alert and oriented to person, place, and time.  Psychiatric:        Behavior: Behavior normal.     ED Results and Treatments Labs (all labs ordered are listed, but only abnormal results are displayed) Labs Reviewed - No data to display                                                                                                                       EKG  EKG Interpretation  Date/Time:    Ventricular Rate:    PR Interval:    QRS Duration:   QT Interval:    QTC Calculation:   R Axis:     Text Interpretation:        Radiology No results found. Pertinent labs & imaging results that were available during my care of the patient were reviewed by me and considered in my medical decision making (see chart for details).  Medications Ordered in ED Medications - No data to display                                                                                                                                  Procedures Procedures  (including critical care time)  Medical Decision Making / ED Course I have reviewed the nursing notes for this encounter and the patient's prior records (if available in EHR or on provided paperwork).    Consistent with tinea pedis with possible cellulitis of the left lateral toes. No precipitous trauma.  Pulses intact. Doubt arterial occlusion. Doubt DVT.   Will treat for possible cellulitis. OTC ans supportive care treatment for tinea pedis.   The patient appears reasonably screened and/or stabilized for discharge and I doubt any other medical condition or other [facility] requiring further screening, evaluation, or  treatment in the ED at this time prior to discharge.  The patient is safe for discharge with strict return precautions.   Final Clinical Impression(s) / ED Diagnoses Final diagnoses:  Cellulitis of toe of left foot  Tinea pedis of both feet    Disposition: Discharge  Condition: Good  I have discussed the results, Dx and Tx  plan with the patient who expressed understanding and agree(s) with the plan. Discharge instructions discussed at great length. The patient was given strict return precautions who verbalized understanding of the instructions. No further questions at time of discharge.    ED Discharge Orders         Ordered    cephALEXin (KEFLEX) 500 MG capsule  2 times daily     05/17/18 2328    clotrimazole (LOTRIMIN) 1 % cream     05/17/18 2328           Follow Up: Primary care provider  Schedule an appointment as soon as possible for a visit  If you do not have a primary care physician, contact [facility] at [phone] for referral     This chart was dictated using voice recognition software.  Despite best efforts to proofread,  errors can occur which can change the documentation meaning.   Nira Conn, MD 05/17/18 [phone]

## 2019-01-19 ENCOUNTER — Other Ambulatory Visit: Payer: Self-pay

## 2019-01-19 DIAGNOSIS — Z20822 Contact with and (suspected) exposure to covid-19: Secondary | ICD-10-CM

## 2019-01-22 LAB — NOVEL CORONAVIRUS, NAA: SARS-CoV-2, NAA: NOT DETECTED

## 2019-01-24 ENCOUNTER — Telehealth: Payer: Self-pay | Admitting: General Practice

## 2019-01-24 NOTE — Telephone Encounter (Signed)
Gave patient negative covid test results Patient understood 

## 2020-06-26 ENCOUNTER — Other Ambulatory Visit: Payer: Self-pay

## 2020-06-26 ENCOUNTER — Emergency Department (HOSPITAL_COMMUNITY)
Admission: EM | Admit: 2020-06-26 | Discharge: 2020-06-26 | Disposition: A | Discharge status: Home / Self Care | Payer: Self-pay | Attending: Emergency Medicine | Admitting: Emergency Medicine

## 2020-06-26 ENCOUNTER — Emergency Department (HOSPITAL_COMMUNITY): Payer: Self-pay

## 2020-06-26 ENCOUNTER — Encounter (HOSPITAL_COMMUNITY): Payer: Self-pay

## 2020-06-26 DIAGNOSIS — R109 Unspecified abdominal pain: Secondary | ICD-10-CM | POA: Insufficient documentation

## 2020-06-26 DIAGNOSIS — Z87891 Personal history of nicotine dependence: Secondary | ICD-10-CM | POA: Insufficient documentation

## 2020-06-26 DIAGNOSIS — R1110 Vomiting, unspecified: Secondary | ICD-10-CM | POA: Insufficient documentation

## 2020-06-26 DIAGNOSIS — J45909 Unspecified asthma, uncomplicated: Secondary | ICD-10-CM | POA: Insufficient documentation

## 2020-06-26 DIAGNOSIS — R1010 Upper abdominal pain, unspecified: Secondary | ICD-10-CM

## 2020-06-26 DIAGNOSIS — R058 Other specified cough: Secondary | ICD-10-CM

## 2020-06-26 DIAGNOSIS — R059 Cough, unspecified: Secondary | ICD-10-CM | POA: Insufficient documentation

## 2020-06-26 LAB — COMPREHENSIVE METABOLIC PANEL
ALT: 18 U/L (ref 0–44)
AST: 19 U/L (ref 15–41)
Albumin: 4.1 g/dL (ref 3.5–5.0)
Alkaline Phosphatase: 60 U/L (ref 38–126)
Anion gap: 9 (ref 5–15)
BUN: 19 mg/dL (ref 6–20)
CO2: 25 mmol/L (ref 22–32)
Calcium: 9.4 mg/dL (ref 8.9–10.3)
Chloride: 108 mmol/L (ref 98–111)
Creatinine, Ser: 1.37 mg/dL — ABNORMAL HIGH (ref 0.61–1.24)
GFR, Estimated: >60 mL/min (ref >60)
Glucose, Bld: 98 mg/dL (ref 70–99)
Potassium: 4.2 mmol/L (ref 3.5–5.1)
Sodium: 142 mmol/L (ref 135–145)
Total Bilirubin: 0.4 mg/dL (ref 0.3–1.2)
Total Protein: 7.3 g/dL (ref 6.5–8.1)

## 2020-06-26 LAB — CBC
HCT: 40.9 % (ref 39.0–52.0)
Hemoglobin: 14.1 g/dL (ref 13.0–17.0)
MCH: 31.1 pg (ref 26.0–34.0)
MCHC: 34.5 g/dL (ref 30.0–36.0)
MCV: 90.1 fL (ref 80.0–100.0)
Platelets: 228 10*3/uL (ref 150–400)
RBC: 4.54 MIL/uL (ref 4.22–5.81)
RDW: 12 % (ref 11.5–15.5)
WBC: 9.7 10*3/uL (ref 4.0–10.5)
nRBC: 0 % (ref 0.0–0.2)

## 2020-06-26 LAB — LIPASE, BLOOD: Lipase: 36 U/L (ref 11–51)

## 2020-06-26 MED ORDER — ACETAMINOPHEN 500 MG PO TABS
1000.0000 mg | ORAL_TABLET | Freq: Once | ORAL | Status: AC
  Administered 2020-06-26: 1000 mg via ORAL
  Filled 2020-06-26: qty 2

## 2020-06-26 NOTE — Discharge Instructions (Signed)
It was our pleasure to provide your ER care today - we hope that you feel better.  Take acetaminophen or ibuprofen as need.   Follow up with primary care doctor in 1 - 2 weeks.   Return to ER if worse, new symptoms, fevers, new, worsening or severe pain, persistent vomiting, trouble breathing, or other concern.

## 2020-06-26 NOTE — ED Triage Notes (Signed)
Emergency Medicine Provider Triage Evaluation Note  Grason Brailsford , a 40 y.o. male  was evaluated in triage.  Pt complains of RUQ abd pain x 1-2 months, dull, constant, non radiating. Also w non prod cough x 1 week. No fever.   Review of Systems  Positive: RUQ pain Negative: No nvd. No fever. No cp or sob.   Physical Exam  BP (!) 142/100 (BP Location: Left Arm)   Pulse 85   Temp 97.9 F (36.6 C) (Oral)   Resp 16   Ht 1.791 m (5' 10.5")   Wt 109 kg   SpO2 98%   BMI 33.98 kg/m  Gen:   Awake, no distress   HEENT:  Atraumatic  Resp:  Normal effort  Cardiac:  Normal rate  Abd:   Nondistended, nontender  MSK:   Moves extremities without difficulty  Neuro:  Speech clear   Medical Decision Making  Medically screening exam initiated at 1:35 PM.  Appropriate orders placed.  Tatum Massman was informed that the remainder of the evaluation will be completed by another provider, this initial triage assessment does not replace that evaluation, and the importance of remaining in the ED until their evaluation is complete.  Clinical Impression  Labs and cxr ordered.   Reviewed nursing notes and prior charts for additional history.      Cathren Laine, MD 06/26/20 1336

## 2020-06-26 NOTE — ED Provider Notes (Signed)
[facility]-EMERGENCY DEPT Provider Note   CSN: [id number] Arrival date & time: 06/26/20  1305     History Chief Complaint  Patient presents with  . Emesis  . Abdominal Pain    Corey Serrano is a 40 y.o. male.  Patient c/o ruq pain x 1-2 months, constant, dull, gradual onset onset, at times worse w certain movement/position, but no consistently exacerbating or alleviating factors. Not related to eating. No radiation of pain. No back pain. Denies pleuritic pain. No injury to area. Denies hx gallstones. No dysuria, hematuria or gu c/o. No abd distension or nausea/vomiting. Is having normal bms. No wt loss. Normal appetite. Also notes intermittent non prod cough in past week. No sore throat or runny nose. No known ill contacts or known covid exposure. No sob. No chest pain. No leg pain or swelling. Denies hx etoh abuse. No hx liver or kidney disease.   The history is provided by the patient.  Emesis Associated symptoms: abdominal pain and cough   Associated symptoms: no chills, no fever, no headaches and no sore throat   Abdominal Pain Associated symptoms: cough   Associated symptoms: no chest pain, no chills, no dysuria, no fever, no hematuria, no shortness of breath and no sore throat        Past Medical History:  Diagnosis Date  . Asthma     Patient Active Problem List   Diagnosis Date Noted  . Lumbar radiculopathy 07/11/2015  . Muscle pain, lumbar 07/11/2015  . Calf pain 07/11/2015  . Strain of calf muscle 07/11/2015  . Lumbosacral strain 07/11/2015  . Weakness of right leg 07/11/2015  . Snoring 07/11/2015  . Morning headache 07/11/2015  . Somnolence, daytime 07/11/2015    Past Surgical History:  Procedure Laterality Date  . FOOT SURGERY    . NO PAST SURGERIES         Family History  Problem Relation Age of Onset  . Hypertension Mother     Social History   Tobacco Use  . Smoking status: Former Smoker    Quit date: 07/15/2014    Years  since quitting: 5.9  . Smokeless tobacco: Never Used  Vaping Use  . Vaping Use: Never used  Substance Use Topics  . Alcohol use: No    Alcohol/week: 0.0 standard drinks  . Drug use: No    Home Medications Prior to Admission medications   Not on File    Allergies    Shellfish allergy and Penicillins  Review of Systems   Review of Systems  Constitutional: Negative for chills and fever.  HENT: Negative for sore throat.   Eyes: Negative for redness.  Respiratory: Positive for cough. Negative for shortness of breath.   Cardiovascular: Negative for chest pain.  Gastrointestinal: Positive for abdominal pain.  Genitourinary: Negative for dysuria, flank pain and hematuria.  Musculoskeletal: Negative for back pain and neck pain.  Skin: Negative for rash.  Neurological: Negative for headaches.  Hematological: Does not bruise/bleed easily.  Psychiatric/Behavioral: Negative for confusion.    Physical Exam Updated Vital Signs BP 125/89 (BP Location: Left Arm)   Pulse 73   Temp 98 F (36.7 C) (Oral)   Resp 18   Ht 1.791 m (5' 10.5")   Wt 109 kg   SpO2 97%   BMI 33.98 kg/m   Physical Exam Vitals and nursing note reviewed.  Constitutional:      Appearance: Normal appearance. He is well-developed.  HENT:     Head: Atraumatic.  Nose: Nose normal.     Mouth/Throat:     Mouth: Mucous membranes are moist.     Pharynx: Oropharynx is clear.  Eyes:     General: No scleral icterus.    Conjunctiva/sclera: Conjunctivae normal.  Neck:     Trachea: No tracheal deviation.  Cardiovascular:     Rate and Rhythm: Normal rate and regular rhythm.     Pulses: Normal pulses.     Heart sounds: Normal heart sounds. No murmur heard. No friction rub. No gallop.   Pulmonary:     Effort: Pulmonary effort is normal. No accessory muscle usage or respiratory distress.     Breath sounds: Normal breath sounds.  Abdominal:     General: Bowel sounds are normal. There is no distension.      Palpations: Abdomen is soft. There is no mass.     Tenderness: There is no abdominal tenderness. There is no guarding or rebound.     Hernia: No hernia is present.  Genitourinary:    Comments: No cva tenderness. Musculoskeletal:        General: No swelling or tenderness.     Cervical back: Normal range of motion and neck supple. No rigidity.     Right lower leg: No edema.     Left lower leg: No edema.  Skin:    General: Skin is warm and dry.     Findings: No rash.  Neurological:     Mental Status: He is alert.     Comments: Alert, speech clear. Steady gait.   Psychiatric:        Mood and Affect: Mood normal.     ED Results / Procedures / Treatments   Labs (all labs ordered are listed, but only abnormal results are displayed) Results for orders placed or performed during the hospital encounter of 06/26/20  Lipase, blood  Result Value Ref Range   Lipase 36 11 - 51 U/L  Comprehensive metabolic panel  Result Value Ref Range   Sodium 142 135 - 145 mmol/L   Potassium 4.2 3.5 - 5.1 mmol/L   Chloride 108 98 - 111 mmol/L   CO2 25 22 - 32 mmol/L   Glucose, Bld 98 70 - 99 mg/dL   BUN 19 6 - 20 mg/dL   Creatinine, Ser 8.83 (H) 0.61 - 1.24 mg/dL   Calcium 9.4 8.9 - 25.4 mg/dL   Total Protein 7.3 6.5 - 8.1 g/dL   Albumin 4.1 3.5 - 5.0 g/dL   AST 19 15 - 41 U/L   ALT 18 0 - 44 U/L   Alkaline Phosphatase 60 38 - 126 U/L   Total Bilirubin 0.4 0.3 - 1.2 mg/dL   GFR, Estimated >98 >26 mL/min   Anion gap 9 5 - 15  CBC  Result Value Ref Range   WBC 9.7 4.0 - 10.5 K/uL   RBC 4.54 4.22 - 5.81 MIL/uL   Hemoglobin 14.1 13.0 - 17.0 g/dL   HCT 41.5 83.0 - 94.0 %   MCV 90.1 80.0 - 100.0 fL   MCH 31.1 26.0 - 34.0 pg   MCHC 34.5 30.0 - 36.0 g/dL   RDW 76.8 08.8 - 11.0 %   Platelets 228 150 - 400 K/uL   nRBC 0.0 0.0 - 0.2 %   DG Chest 2 View  Result Date: 06/26/2020 CLINICAL DATA:  Right upper quadrant pain for 2 months. EXAM: CHEST - 2 VIEW COMPARISON:  02/12/2018 FINDINGS: The heart  size and mediastinal contours are within normal limits.  Both lungs are clear. No pleural effusion or pneumothorax. The visualized skeletal structures are unremarkable. IMPRESSION: Normal chest radiographs. Electronically Signed   By: Amie Portland M.D.   On: 06/26/2020 14:14    EKG None  Radiology DG Chest 2 View  Result Date: 06/26/2020 CLINICAL DATA:  Right upper quadrant pain for 2 months. EXAM: CHEST - 2 VIEW COMPARISON:  02/12/2018 FINDINGS: The heart size and mediastinal contours are within normal limits. Both lungs are clear. No pleural effusion or pneumothorax. The visualized skeletal structures are unremarkable. IMPRESSION: Normal chest radiographs. Electronically Signed   By: Amie Portland M.D.   On: 06/26/2020 14:14    Procedures Procedures   Medications Ordered in ED Medications - No data to display  ED Course  I have reviewed the triage vital signs and the nursing notes.  Pertinent labs & imaging results that were available during my care of the patient were reviewed by me and considered in my medical decision making (see chart for details).    MDM Rules/Calculators/A&P                         Labs and imaging.   Reviewed nursing notes and prior charts for additional history.   Po fluids.   Labs reviewed/interpreted by me - chem normal.   CXR reviewed/interpreted by me - no pna. \  Recheck abd soft nt.   Pt currently appears stable for d/c.   Rec pcp f/u.  Return precautions provided.    Final Clinical Impression(s) / ED Diagnoses Final diagnoses:  None    Rx / DC Orders ED Discharge Orders    None       Cathren Laine, MD 06/26/20 1531

## 2020-06-26 NOTE — ED Triage Notes (Signed)
Patient c/o RUQ pain x 2 months. Patient c/o eyes being yellow.

## 2020-07-09 NOTE — Progress Notes (Signed)
Subjective:    Corey Serrano - 40 y.o. male MRN [id number]  Date of birth: September 03, 1980  HPI  Corey Serrano is to establish care and hospital discharge follow-up. Patient has a PMH significant for asthma and lumbar radiculopathy.   Current issues and/or concerns: Visit 06/26/2020 at [facility] Emergency Department per MD note: Labs and imaging.   Reviewed nursing notes and prior charts for additional history.   Po fluids.   Labs reviewed/interpreted by me - chem normal.   CXR reviewed/interpreted by me - no pna. \  Recheck abd soft nt.   Pt currently appears stable for d/c.   Rec pcp f/u.  Return precautions provided.   07/11/2020: Time since discharge: 15 days Hospital/facility: [facility] Emergency Department Diagnosis: upper abdominal pain, non-productive cough Procedures/tests: diagnostic chest x-ray, CMP, CBC, lipase blood Consultants: none New medications: none Discharge instructions: follow-up with PCP Status: Reports feeling the same since hospital discharge. Right side pain beneath ribs with burning sensation. 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 Unable to hold down much food with episodes of vomiting. Describes vomiting as mostly water. Endorses bowel movements are normal and without blood or mucus. Does have some acid reflux mostly when eating spicy foods, not taking medication for it. Denies shortness of breath. Concern for elevated creatinine on most recent lab work and would like to know if blood work should be repeated.  ROS per HPI   Health Maintenance:  Health Maintenance Due  Topic Date Due  . Hepatitis C Screening  Never done  . HIV Screening  Never done    Past Medical History: Patient Active Problem List   Diagnosis Date Noted  . Lumbar radiculopathy 07/11/2015  . Muscle pain, lumbar 07/11/2015  . Calf pain 07/11/2015  . Strain of calf muscle 07/11/2015  . Lumbosacral strain 07/11/2015  . Weakness of right leg  07/11/2015  . Snoring 07/11/2015  . Morning headache 07/11/2015  . Somnolence, daytime 07/11/2015    Social History   reports that he quit smoking about 5 years ago. He has never used smokeless tobacco. He reports current alcohol use of about 3.0 standard drinks of alcohol per week. He reports that he does not use drugs.   Family History  family history includes Drug abuse in his father; Heart disease in his father.   Medications: reviewed and updated   Objective:   Physical Exam BP 129/81 (BP Location: Left Arm, Patient Position: Sitting)   Pulse 89   Ht 5' 11.26" (1.81 m)   Wt 232 lb (105.2 kg)   SpO2 96%   BMI 32.12 kg/m  Physical Exam HENT:     Head: Normocephalic and atraumatic.  Eyes:     Extraocular Movements: Extraocular movements intact.     Conjunctiva/sclera: Conjunctivae normal.     Pupils: Pupils are equal, round, and reactive to light.  Cardiovascular:     Rate and Rhythm: Normal rate and regular rhythm.     Pulses: Normal pulses.     Heart sounds: Normal heart sounds.  Pulmonary:     Effort: Pulmonary effort is normal.  Musculoskeletal:     Cervical back: Normal range of motion and neck supple.  Neurological:     General: No focal deficit present.     Mental Status: He is alert and oriented to person, place, and time.  Psychiatric:        Mood and Affect: Mood normal.        Behavior: Behavior normal.  Assessment & Plan:  1. Hospital discharge follow-up: 2. Upper abdominal pain: 3. Non-productive cough: 4. Chronic right flank pain: - Today patient reports feeling about the same since hospital discharge.  - Diagnostic chest x-ray last obtained 06/26/2020 and normal.  - Ultrasound of abdomen for further evaluation and management.  - POCT URINALYSIS DIP (CLINITEK) - US Abdomen Complete; Future  5. Gastroesophageal reflux disease, unspecified whether esophagitis present: - Omeprazole as prescribed.  - You may feel better if you: ?Avoid  foods that make your symptoms worse - For some people these include coffee, chocolate, alcohol, peppermint, and fatty foods. ?Stop smoking, if you smoke  ?Avoid late meals - Lying down with a full stomach can make reflux worse. Try to plan meals for at least 2 to 3 hours before bedtime. - Follow-up with primary provider as scheduled.   - omeprazole (PRILOSEC) 20 MG capsule; Take 1 capsule (20 mg total) by mouth daily.  Dispense: 90 capsule; Refill: 0  6. Screening for metabolic disorder: - Last BMP obtained 06/26/2020. Mildly elevated creatinine at that time. Kidney function normal at that time.  - Repeat BMP within 2 to 4 weeks or sooner if needed.  - Counseled to avoid overuse of NSAID's such as Ibuprofen, Aleve, Motrin, and Naproxen as these may affect kidney function and to stay hydrated with water. Patient verbalized understanding. - Basic Metabolic Panel; Future    Patient was given clear instructions to go to Emergency Department or return to medical center if symptoms don't improve, worsen, or new problems develop.The patient verbalized understanding.  I discussed the assessment and treatment plan with the patient. The patient was provided an opportunity to ask questions and all were answered. The patient agreed with the plan and demonstrated an understanding of the instructions.   The patient was advised to call back or seek an in-person evaluation if the symptoms worsen or if the condition fails to improve as anticipated.    Ricky Stabs, NP 07/11/2020, 4:35 PM Primary Care at [facility]

## 2020-07-11 ENCOUNTER — Ambulatory Visit (INDEPENDENT_AMBULATORY_CARE_PROVIDER_SITE_OTHER): Payer: Self-pay | Admitting: Family

## 2020-07-11 ENCOUNTER — Other Ambulatory Visit: Payer: Self-pay

## 2020-07-11 ENCOUNTER — Encounter: Payer: Self-pay | Admitting: Family

## 2020-07-11 VITALS — BP 129/81 | HR 89 | Ht 71.26 in | Wt 232.0 lb

## 2020-07-11 DIAGNOSIS — R109 Unspecified abdominal pain: Secondary | ICD-10-CM

## 2020-07-11 DIAGNOSIS — G8929 Other chronic pain: Secondary | ICD-10-CM

## 2020-07-11 DIAGNOSIS — Z09 Encounter for follow-up examination after completed treatment for conditions other than malignant neoplasm: Secondary | ICD-10-CM

## 2020-07-11 DIAGNOSIS — Z13228 Encounter for screening for other metabolic disorders: Secondary | ICD-10-CM

## 2020-07-11 DIAGNOSIS — R058 Other specified cough: Secondary | ICD-10-CM

## 2020-07-11 DIAGNOSIS — R1010 Upper abdominal pain, unspecified: Secondary | ICD-10-CM

## 2020-07-11 DIAGNOSIS — K219 Gastro-esophageal reflux disease without esophagitis: Secondary | ICD-10-CM

## 2020-07-11 LAB — POCT URINALYSIS DIP (CLINITEK)
Bilirubin, UA: NEGATIVE
Blood, UA: NEGATIVE
Glucose, UA: NEGATIVE mg/dL
Ketones, POC UA: NEGATIVE mg/dL
Leukocytes, UA: NEGATIVE
Nitrite, UA: NEGATIVE
POC PROTEIN,UA: NEGATIVE
Spec Grav, UA: 1.025 (ref 1.010–1.025)
Urobilinogen, UA: 0.2 E.U./dL
pH, UA: 7 (ref 5.0–8.0)

## 2020-07-11 MED ORDER — OMEPRAZOLE 20 MG PO CPDR
20.0000 mg | DELAYED_RELEASE_CAPSULE | Freq: Every day | ORAL | 0 refills | Status: AC
  Filled 2020-07-11: qty 30, 30d supply, fill #0

## 2020-07-11 NOTE — Patient Instructions (Addendum)
   Omeprazole for acid reflux. Follow-up in 4 to 6 weeks or sooner if needed.   Abdominal ultrasound for chronic abdominal pain.  Return in 2 to 4 weeks for repeat kidney labs.  Please report to Emergency Department or return to medical center if symptoms don't improve, worsen, or new problems develop. Thank you for choosing Primary Care at [facility] for your medical home!    Corey Serrano was seen by Rema Fendt, NP today.   Corey Serrano's primary care provider is Rema Fendt, NP.   For the best care possible,  you should try to see Ricky Stabs, NP whenever you come to clinic.   We look forward to seeing you again soon!  If you have any questions about your visit today,  please call us at [phone]  Or feel free to reach your provider via MyChart.    Abdominal Pain, Adult Many things can cause belly (abdominal) pain. Most times, belly pain is not dangerous. Many cases of belly pain can be watched and treated at home. Sometimes, though, belly pain is serious. Your doctor will try to find the cause of your belly pain. Follow these instructions at home: Medicines  Take over-the-counter and prescription medicines only as told by your doctor.  Do not take medicines that help you poop (laxatives) unless told by your doctor. General instructions  Watch your belly pain for any changes.  Drink enough fluid to keep your pee (urine) pale yellow.  Keep all follow-up visits as told by your doctor. This is important.   Contact a doctor if:  Your belly pain changes or gets worse.  You are not hungry, or you lose weight without trying.  You are having trouble pooping (constipated) or have watery poop (diarrhea) for more than 2-3 days.  You have pain when you pee or poop.  Your belly pain wakes you up at night.  Your pain gets worse with meals, after eating, or with certain foods.  You are vomiting and cannot keep anything down.  You have a fever.  You have blood in  your pee. Get help right away if:  Your pain does not go away as soon as your doctor says it should.  You cannot stop vomiting.  Your pain is only in areas of your belly, such as the right side or the left lower part of the belly.  You have bloody or black poop, or poop that looks like tar.  You have very bad pain, cramping, or bloating in your belly.  You have signs of not having enough fluid or water in your body (dehydration), such as: ? Dark pee, very little pee, or no pee. ? Cracked lips. ? Dry mouth. ? Sunken eyes. ? Sleepiness. ? Weakness.  You have trouble breathing or chest pain. Summary  Many cases of belly pain can be watched and treated at home.  Watch your belly pain for any changes.  Take over-the-counter and prescription medicines only as told by your doctor.  Contact a doctor if your belly pain changes or gets worse.  Get help right away if you have very bad pain, cramping, or bloating in your belly. This information is not intended to replace advice given to you by your health care provider. Make sure you discuss any questions you have with your health care provider. Document Revised: 07/17/2018 Document Reviewed: 07/17/2018 Elsevier Patient Education  2021 [employer].

## 2020-07-11 NOTE — Progress Notes (Signed)
Establish care Kidney pain right side associated w/aching burning sensation under ribs

## 2020-07-14 ENCOUNTER — Other Ambulatory Visit: Payer: Self-pay

## 2020-07-21 ENCOUNTER — Other Ambulatory Visit: Payer: Self-pay

## 2020-07-23 NOTE — Progress Notes (Signed)
Patient did not show for appointment.   

## 2020-07-25 ENCOUNTER — Encounter: Payer: Self-pay | Admitting: Family

## 2020-07-25 DIAGNOSIS — Z7689 Persons encountering health services in other specified circumstances: Secondary | ICD-10-CM

## 2020-07-31 ENCOUNTER — Ambulatory Visit (HOSPITAL_COMMUNITY): Payer: Self-pay | Attending: Family

## 2020-10-09 ENCOUNTER — Emergency Department (HOSPITAL_BASED_OUTPATIENT_CLINIC_OR_DEPARTMENT_OTHER)
Admission: EM | Admit: 2020-10-09 | Discharge: 2020-10-09 | Disposition: A | Discharge status: Home / Self Care | Payer: Self-pay | Attending: Emergency Medicine | Admitting: Emergency Medicine

## 2020-10-09 ENCOUNTER — Other Ambulatory Visit: Payer: Self-pay

## 2020-10-09 ENCOUNTER — Encounter (HOSPITAL_BASED_OUTPATIENT_CLINIC_OR_DEPARTMENT_OTHER): Payer: Self-pay

## 2020-10-09 DIAGNOSIS — J45909 Unspecified asthma, uncomplicated: Secondary | ICD-10-CM | POA: Insufficient documentation

## 2020-10-09 DIAGNOSIS — Z23 Encounter for immunization: Secondary | ICD-10-CM | POA: Insufficient documentation

## 2020-10-09 DIAGNOSIS — S61221A Laceration with foreign body of left index finger without damage to nail, initial encounter: Secondary | ICD-10-CM | POA: Insufficient documentation

## 2020-10-09 DIAGNOSIS — S61211A Laceration without foreign body of left index finger without damage to nail, initial encounter: Secondary | ICD-10-CM

## 2020-10-09 DIAGNOSIS — W268XXA Contact with other sharp object(s), not elsewhere classified, initial encounter: Secondary | ICD-10-CM | POA: Insufficient documentation

## 2020-10-09 DIAGNOSIS — Z87891 Personal history of nicotine dependence: Secondary | ICD-10-CM | POA: Insufficient documentation

## 2020-10-09 MED ORDER — LIDOCAINE HCL (PF) 1 % IJ SOLN
5.0000 mL | Freq: Once | INTRAMUSCULAR | Status: AC
  Administered 2020-10-09: 5 mL
  Filled 2020-10-09: qty 5

## 2020-10-09 MED ORDER — TETANUS-DIPHTH-ACELL PERTUSSIS 5-2.5-18.5 LF-MCG/0.5 IM SUSY
0.5000 mL | PREFILLED_SYRINGE | Freq: Once | INTRAMUSCULAR | Status: AC
  Administered 2020-10-09: 0.5 mL via INTRAMUSCULAR
  Filled 2020-10-09: qty 0.5

## 2020-10-09 NOTE — ED Triage Notes (Signed)
Pt states he was outside cutting turf and cut index finger on left hand with razor blade. States last tetanus 8 years ago. Bleeding controlled during triage

## 2020-10-09 NOTE — Discharge Instructions (Addendum)
Keep wound clean and dry. Splint joint and cover wound if you are to be using her hands otherwise allow wound to be open.

## 2020-10-09 NOTE — ED Provider Notes (Signed)
[facility] EMERGENCY DEPARTMENT Provider Note   CSN: [id number] Arrival date & time: 10/09/20  1029     History Chief Complaint  Patient presents with   Laceration    Corey Serrano is a 40 y.o. male.  40 year old male presents with left index finger laceration.  Patient is right-hand dominant, was using a razor blade to cut her foot when he accidentally cut his left index finger.  Last tetanus was 8 years ago, bleeding is controlled, denies weakness or numbness in the finger.  No other injuries.      Past Medical History:  Diagnosis Date   Asthma     Patient Active Problem List   Diagnosis Date Noted   Lumbar radiculopathy 07/11/2015   Muscle pain, lumbar 07/11/2015   Calf pain 07/11/2015   Strain of calf muscle 07/11/2015   Lumbosacral strain 07/11/2015   Weakness of right leg 07/11/2015   Snoring 07/11/2015   Morning headache 07/11/2015   Somnolence, daytime 07/11/2015    Past Surgical History:  Procedure Laterality Date   FOOT SURGERY         Family History  Problem Relation Age of Onset   Heart disease Father    Drug abuse Father     Social History   Tobacco Use   Smoking status: Former    Types: Cigarettes    Quit date: 07/15/2014    Years since quitting: 6.2   Smokeless tobacco: Never  Vaping Use   Vaping Use: Never used  Substance Use Topics   Alcohol use: Yes    Alcohol/week: 3.0 standard drinks    Types: 3 Glasses of wine per week    Comment: occassionally   Drug use: No    Home Medications Prior to Admission medications   Medication Sig Start Date End Date Taking? Authorizing Provider  omeprazole (PRILOSEC) 20 MG capsule Take 1 capsule (20 mg total) by mouth daily. 07/11/20   Rema Fendt, NP    Allergies    Shellfish allergy and Penicillins  Review of Systems   Review of Systems  Constitutional:  Negative for fever.  Musculoskeletal:  Positive for joint swelling.  Skin:  Positive for wound. Negative for color  change and rash.  Allergic/Immunologic: Negative for immunocompromised state.  Neurological:  Negative for weakness and numbness.  Hematological:  Does not bruise/bleed easily.  Psychiatric/Behavioral:  Negative for self-injury.   All other systems reviewed and are negative.  Physical Exam Updated Vital Signs BP 117/84 (BP Location: Right Arm)   Pulse 86   Temp 98.5 F (36.9 C) (Oral)   Resp 16   Ht 5\' 11"  (1.803 m)   Wt 102.1 kg   SpO2 98%   BMI 31.38 kg/m   Physical Exam Vitals and nursing note reviewed.  Constitutional:      General: He is not in acute distress.    Appearance: He is well-developed. He is not diaphoretic.  HENT:     Head: Normocephalic and atraumatic.  Cardiovascular:     Pulses: Normal pulses.  Pulmonary:     Effort: Pulmonary effort is normal.  Musculoskeletal:     Comments: Laceration to dorsal left index finger at the PIP.  Bleeding controlled, sensation intact, good strength against resistance.  Skin:    General: Skin is warm and dry.     Capillary Refill: Capillary refill takes less than 2 seconds.     Findings: No erythema or rash.  Neurological:     Mental Status: He is alert  and oriented to person, place, and time.     Sensory: No sensory deficit.     Motor: No weakness.  Psychiatric:        Behavior: Behavior normal.    ED Results / Procedures / Treatments   Labs (all labs ordered are listed, but only abnormal results are displayed) Labs Reviewed - No data to display  EKG None  Radiology No results found.  Procedures .Marland KitchenLaceration Repair  Date/Time: 10/09/2020 12:13 PM Performed by: Jeannie Fend, PA-C Authorized by: Jeannie Fend, PA-C   Consent:    Consent obtained:  Verbal   Consent given by:  Patient   Risks discussed:  Infection, need for additional repair, pain, poor cosmetic result and poor wound healing   Alternatives discussed:  No treatment and delayed treatment Universal protocol:    Procedure explained and  questions answered to patient or proxy's satisfaction: yes     Relevant documents present and verified: yes     Test results available: yes     Imaging studies available: yes     Required blood products, implants, devices, and special equipment available: yes     Site/side marked: yes     Immediately prior to procedure, a time out was called: yes     Patient identity confirmed:  Verbally with patient Anesthesia:    Anesthesia method:  Nerve block   Block location:  Digital block   Block needle gauge:  25 G   Block anesthetic:  Lidocaine 1% w/o epi   Block technique:  Digital block   Block injection procedure:  Anatomic landmarks palpated, anatomic landmarks identified, introduced needle and incremental injection   Block outcome:  Anesthesia achieved Laceration details:    Location:  Finger   Finger location:  L index finger   Length (cm):  3   Depth (mm):  3 Pre-procedure details:    Preparation:  Patient was prepped and draped in usual sterile fashion Exploration:    Wound exploration: wound explored through full range of motion and entire depth of wound visualized     Wound extent: no foreign bodies/material noted, no muscle damage noted, no nerve damage noted, no tendon damage noted and no vascular damage noted     Contaminated: no   Treatment:    Area cleansed with:  Saline   Amount of cleaning:  Standard   Irrigation solution:  Sterile saline Skin repair:    Repair method:  Sutures   Suture size:  4-0   Suture material:  Nylon   Suture technique:  Simple interrupted   Number of sutures:  5 Approximation:    Approximation:  Close Repair type:    Repair type:  Simple Post-procedure details:    Dressing:  Splint for protection   Procedure completion:  Tolerated well, no immediate complications   Medications Ordered in ED Medications  lidocaine (PF) (XYLOCAINE) 1 % injection 5 mL (5 mLs Infiltration Given by Other 10/09/20 1126)  Tdap (BOOSTRIX) injection 0.5 mL (0.5  mLs Intramuscular Given 10/09/20 1124)    ED Course  I have reviewed the triage vital signs and the nursing notes.  Pertinent labs & imaging results that were available during my care of the patient were reviewed by me and considered in my medical decision making (see chart for details).  Clinical Course as of 10/09/20 1233  Thu Oct 09, 2020  5160 40 year old male with laceration to left index finger as above.  On exam, found to have sensation intact with  strong strength against resistance in full extension.  Doubt tendon injury.  Wound was anesthetized, irrigated, no tendon injury visualized.  Wound was closed with 5 simple interrupted sutures without difficulty.  Plan is to splint the knuckle, recommend wound check in 2 days and suture removal in 10-12.  Tetanus updated. [LM]    Clinical Course User Index [LM] Alden Hipp   MDM Rules/Calculators/A&P                           Final Clinical Impression(s) / ED Diagnoses Final diagnoses:  Laceration of left index finger without foreign body without damage to nail, initial encounter    Rx / DC Orders ED Discharge Orders     None        Alden Hipp 10/09/20 1233    Gwyneth Sprout, MD 10/10/20 1510

## 2020-10-21 ENCOUNTER — Encounter (HOSPITAL_BASED_OUTPATIENT_CLINIC_OR_DEPARTMENT_OTHER): Payer: Self-pay | Admitting: *Deleted

## 2020-10-21 ENCOUNTER — Emergency Department (HOSPITAL_BASED_OUTPATIENT_CLINIC_OR_DEPARTMENT_OTHER)
Admission: EM | Admit: 2020-10-21 | Discharge: 2020-10-21 | Disposition: A | Discharge status: Home / Self Care | Payer: Self-pay | Attending: Emergency Medicine | Admitting: Emergency Medicine

## 2020-10-21 DIAGNOSIS — J45909 Unspecified asthma, uncomplicated: Secondary | ICD-10-CM | POA: Insufficient documentation

## 2020-10-21 DIAGNOSIS — Z4802 Encounter for removal of sutures: Secondary | ICD-10-CM | POA: Insufficient documentation

## 2020-10-21 DIAGNOSIS — Z87891 Personal history of nicotine dependence: Secondary | ICD-10-CM | POA: Insufficient documentation

## 2020-10-21 DIAGNOSIS — M79645 Pain in left finger(s): Secondary | ICD-10-CM | POA: Insufficient documentation

## 2020-10-21 NOTE — ED Provider Notes (Signed)
[facility] EMERGENCY DEPARTMENT Provider Note   CSN: [id number] Arrival date & time: 10/21/20  1018     History Chief Complaint  Patient presents with   Suture / Staple Removal    Corey Serrano is a 40 y.o. male.  HPI 40 year old presents the ER for suture removal.  Patient had sutures placed to his left index finger approximate 12 days ago.  Patient reports that he has had no redness or drainage to the area but does report having some difficulty with range of motion of his index finger.  Reports that he does have pain intermittently.  Patient was concerned that he may have some tendon damage.  Patient had no fever or chills.    Past Medical History:  Diagnosis Date   Asthma     Patient Active Problem List   Diagnosis Date Noted   Lumbar radiculopathy 07/11/2015   Muscle pain, lumbar 07/11/2015   Calf pain 07/11/2015   Strain of calf muscle 07/11/2015   Lumbosacral strain 07/11/2015   Weakness of right leg 07/11/2015   Snoring 07/11/2015   Morning headache 07/11/2015   Somnolence, daytime 07/11/2015    Past Surgical History:  Procedure Laterality Date   FOOT SURGERY         Family History  Problem Relation Age of Onset   Heart disease Father    Drug abuse Father     Social History   Tobacco Use   Smoking status: Former    Types: Cigarettes    Quit date: 07/15/2014    Years since quitting: 6.2   Smokeless tobacco: Never  Vaping Use   Vaping Use: Never used  Substance Use Topics   Alcohol use: Yes    Alcohol/week: 3.0 standard drinks    Types: 3 Glasses of wine per week    Comment: occassionally   Drug use: No    Home Medications Prior to Admission medications   Medication Sig Start Date End Date Taking? Authorizing Provider  omeprazole (PRILOSEC) 20 MG capsule Take 1 capsule (20 mg total) by mouth daily. 07/11/20   Rema Fendt, NP    Allergies    Shellfish allergy and Penicillins  Review of Systems   Review of Systems   Constitutional:  Negative for chills and fever.  HENT:  Negative for congestion.   Eyes:  Negative for discharge.  Respiratory:  Negative for cough.   Musculoskeletal:  Positive for arthralgias, joint swelling and myalgias.  Skin:  Negative for color change.  Neurological:  Negative for weakness and numbness.  Psychiatric/Behavioral:  Negative for confusion.    Physical Exam Updated Vital Signs BP (!) 142/95 (BP Location: Right Arm)   Pulse 88   Temp 98.1 F (36.7 C) (Oral)   Resp 16   Ht 5\' 11"  (1.803 m)   Wt 102 kg   SpO2 100%   BMI 31.36 kg/m   Physical Exam Vitals and nursing note reviewed.  Constitutional:      General: He is not in acute distress.    Appearance: He is well-developed. He is not ill-appearing or toxic-appearing.  HENT:     Head: Normocephalic and atraumatic.  Eyes:     General: No scleral icterus.       Right eye: No discharge.        Left eye: No discharge.  Pulmonary:     Effort: No respiratory distress.  Musculoskeletal:        General: Normal range of motion.     Cervical  back: Normal range of motion.  Skin:    General: Skin is warm and dry.     Capillary Refill: Capillary refill takes less than 2 seconds.     Coloration: Skin is not pale.     Comments: Patient with sutures in place to left index finger.  There are 5 sutures.  Patient has no drainage or erythema.  There is no dehiscence of the wound.  Wound is healing very well.  Patient does have some mild edema over the finger itself.  He does have limited range of motion.  Cap refill is normal.  Sensation intact.  Neurological:     Mental Status: He is alert.  Psychiatric:        Behavior: Behavior normal.        Thought Content: Thought content normal.        Judgment: Judgment normal.    ED Results / Procedures / Treatments   Labs (all labs ordered are listed, but only abnormal results are displayed) Labs Reviewed - No data to display  EKG None  Radiology No results  found.  Procedures Procedures   Medications Ordered in ED Medications - No data to display  ED Course  I have reviewed the triage vital signs and the nursing notes.  Pertinent labs & imaging results that were available during my care of the patient were reviewed by me and considered in my medical decision making (see chart for details).    MDM Rules/Calculators/A&P                           41 year old presents for suture removal.  Patient's wound is healing very well.  No signs of infection.  Patient neurovascularly intact.  Does have some limited range of motion secondary to the swelling.  I have suspicion for septic arthritis.  This may be secondary to edema versus possible underlying tendon involvement.  I do think patient would benefit from possibly hand follow-up given that he needs the use of his hand and finger.  I will place referral to hand surgery.  At this time do not feel that antibiotics are indicated.  Sutures were removed. Final Clinical Impression(s) / ED Diagnoses Final diagnoses:  Visit for suture removal    Rx / DC Orders ED Discharge Orders     None        Wallace Keller 10/21/20 1038    Pricilla Loveless, MD 10/21/20 1252

## 2020-10-21 NOTE — Discharge Instructions (Addendum)
Keep wound clean and dry.  Wear the splint for comfort.  Ice and elevate the area.  Motrin and Tylenol for pain and swelling.  Please follow-up with a hand provider.  Return to the ER any worsening symptoms.

## 2020-10-21 NOTE — ED Notes (Signed)
AVS provided to client, reviewed the need to continue observing for signs and symptoms of infection. Keeping site clean and dry. Opportunity for questions provided prior to DC to home.  Copy of AVS given to client as well

## 2020-10-21 NOTE — ED Notes (Signed)
ED PA in to re-evaluate clients laceration/ suture site, 5 sutures removed from left 2nd digit, area cleaned prior to removal, no complications noted, EMT/ Nurse Tech applied splint to finger per ED PA request. Pt tolerated procedure well

## 2020-10-21 NOTE — ED Triage Notes (Signed)
Here for suture removal, initial injury to left index finger, cut with a box razor, site appears swollen, no redness or drainage noted

## 2022-03-17 ENCOUNTER — Other Ambulatory Visit: Payer: Self-pay

## 2022-03-17 ENCOUNTER — Emergency Department (HOSPITAL_COMMUNITY): Payer: Self-pay

## 2022-03-17 ENCOUNTER — Emergency Department (HOSPITAL_COMMUNITY)
Admission: EM | Admit: 2022-03-17 | Discharge: 2022-03-17 | Disposition: A | Discharge status: Home / Self Care | Payer: Self-pay | Attending: Emergency Medicine | Admitting: Emergency Medicine

## 2022-03-17 ENCOUNTER — Other Ambulatory Visit (HOSPITAL_COMMUNITY): Payer: Self-pay

## 2022-03-17 DIAGNOSIS — Y92410 Unspecified street and highway as the place of occurrence of the external cause: Secondary | ICD-10-CM | POA: Insufficient documentation

## 2022-03-17 DIAGNOSIS — J45909 Unspecified asthma, uncomplicated: Secondary | ICD-10-CM | POA: Diagnosis not present

## 2022-03-17 DIAGNOSIS — S0990XA Unspecified injury of head, initial encounter: Secondary | ICD-10-CM | POA: Diagnosis present

## 2022-03-17 MED ORDER — IBUPROFEN 800 MG PO TABS
800.0000 mg | ORAL_TABLET | Freq: Once | ORAL | Status: AC
  Administered 2022-03-17: 800 mg via ORAL
  Filled 2022-03-17: qty 1

## 2022-03-17 MED ORDER — IBUPROFEN 800 MG PO TABS
800.0000 mg | ORAL_TABLET | Freq: Three times a day (TID) | ORAL | 0 refills | Status: AC | PRN
  Filled 2022-03-17: qty 21, 7d supply, fill #0

## 2022-03-17 NOTE — ED Notes (Signed)
Patient transported to X-ray 

## 2022-03-17 NOTE — Discharge Instructions (Signed)
Your CT scan is negative.  It is not unusual to have headaches, nausea and dizziness for several days to weeks after an injury.  Take the ibuprofen as prescribed and use Tylenol in between.  You may alternate them every 3-4 hours.  Follow-up with your doctor.  Return to the ED with worsening headache, confusion, unilateral weakness, numbness, tingling, difficulty breathing or any other concerns.
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
## 2022-03-17 NOTE — ED Notes (Signed)
Pt states his head is still hurting 10/10
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
## 2022-03-17 NOTE — ED Notes (Signed)
Pt ambulated to the restroom with steady gait. 

## 2022-03-17 NOTE — ED Notes (Signed)
Pt returned to hallway 

## 2022-03-17 NOTE — ED Notes (Signed)
Pt able to drink water without n/v.

## 2022-03-18 ENCOUNTER — Other Ambulatory Visit (HOSPITAL_COMMUNITY): Payer: Self-pay

## 2022-04-01 ENCOUNTER — Other Ambulatory Visit (HOSPITAL_COMMUNITY): Payer: Self-pay

## 2022-07-22 IMAGING — CR DG CHEST 2V
2 series · 2 of 2 positions shown · non-contrast
Comparison: 02/12/2018

CLINICAL DATA: Right upper quadrant pain for 2 months.

EXAM:
CHEST - 2 VIEW

[w chest pa]
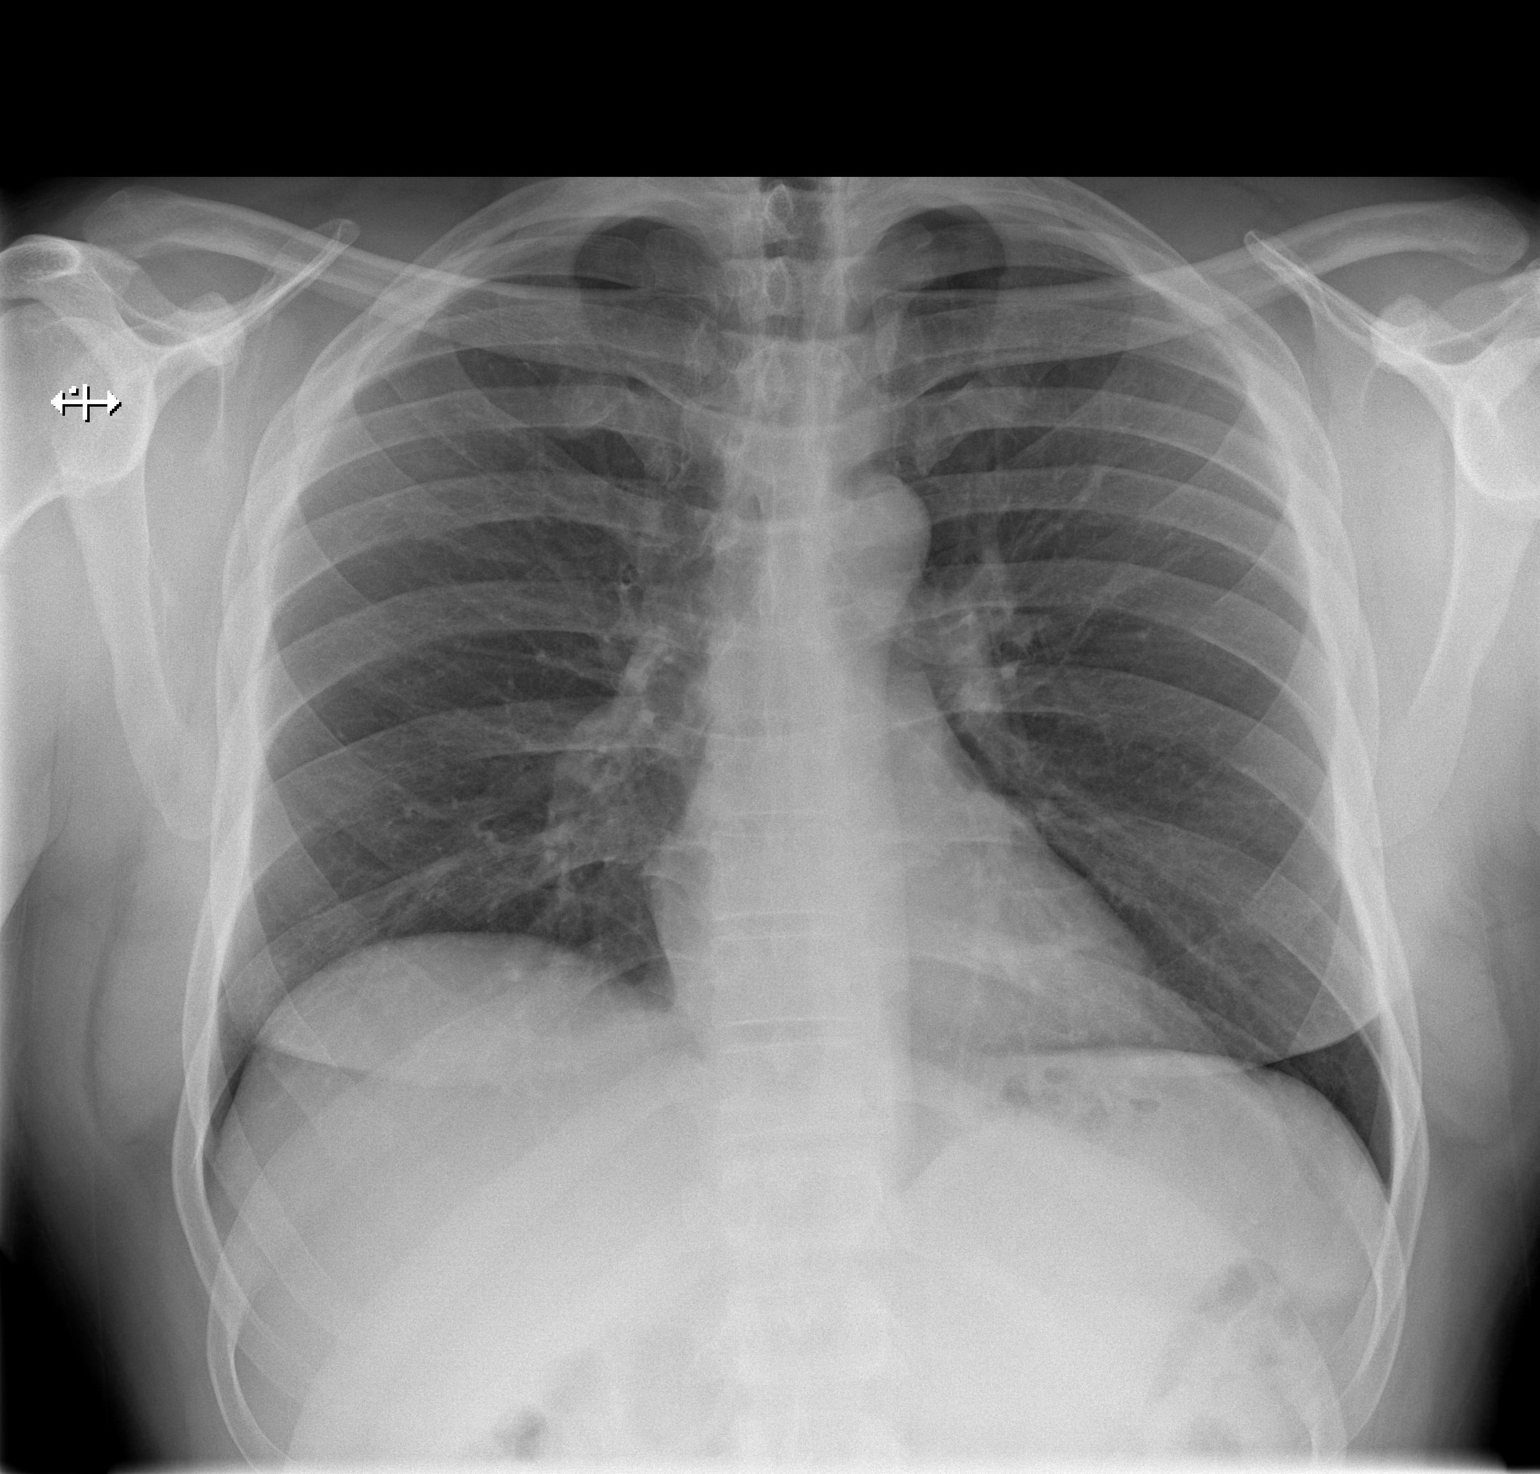

[w chest lat]
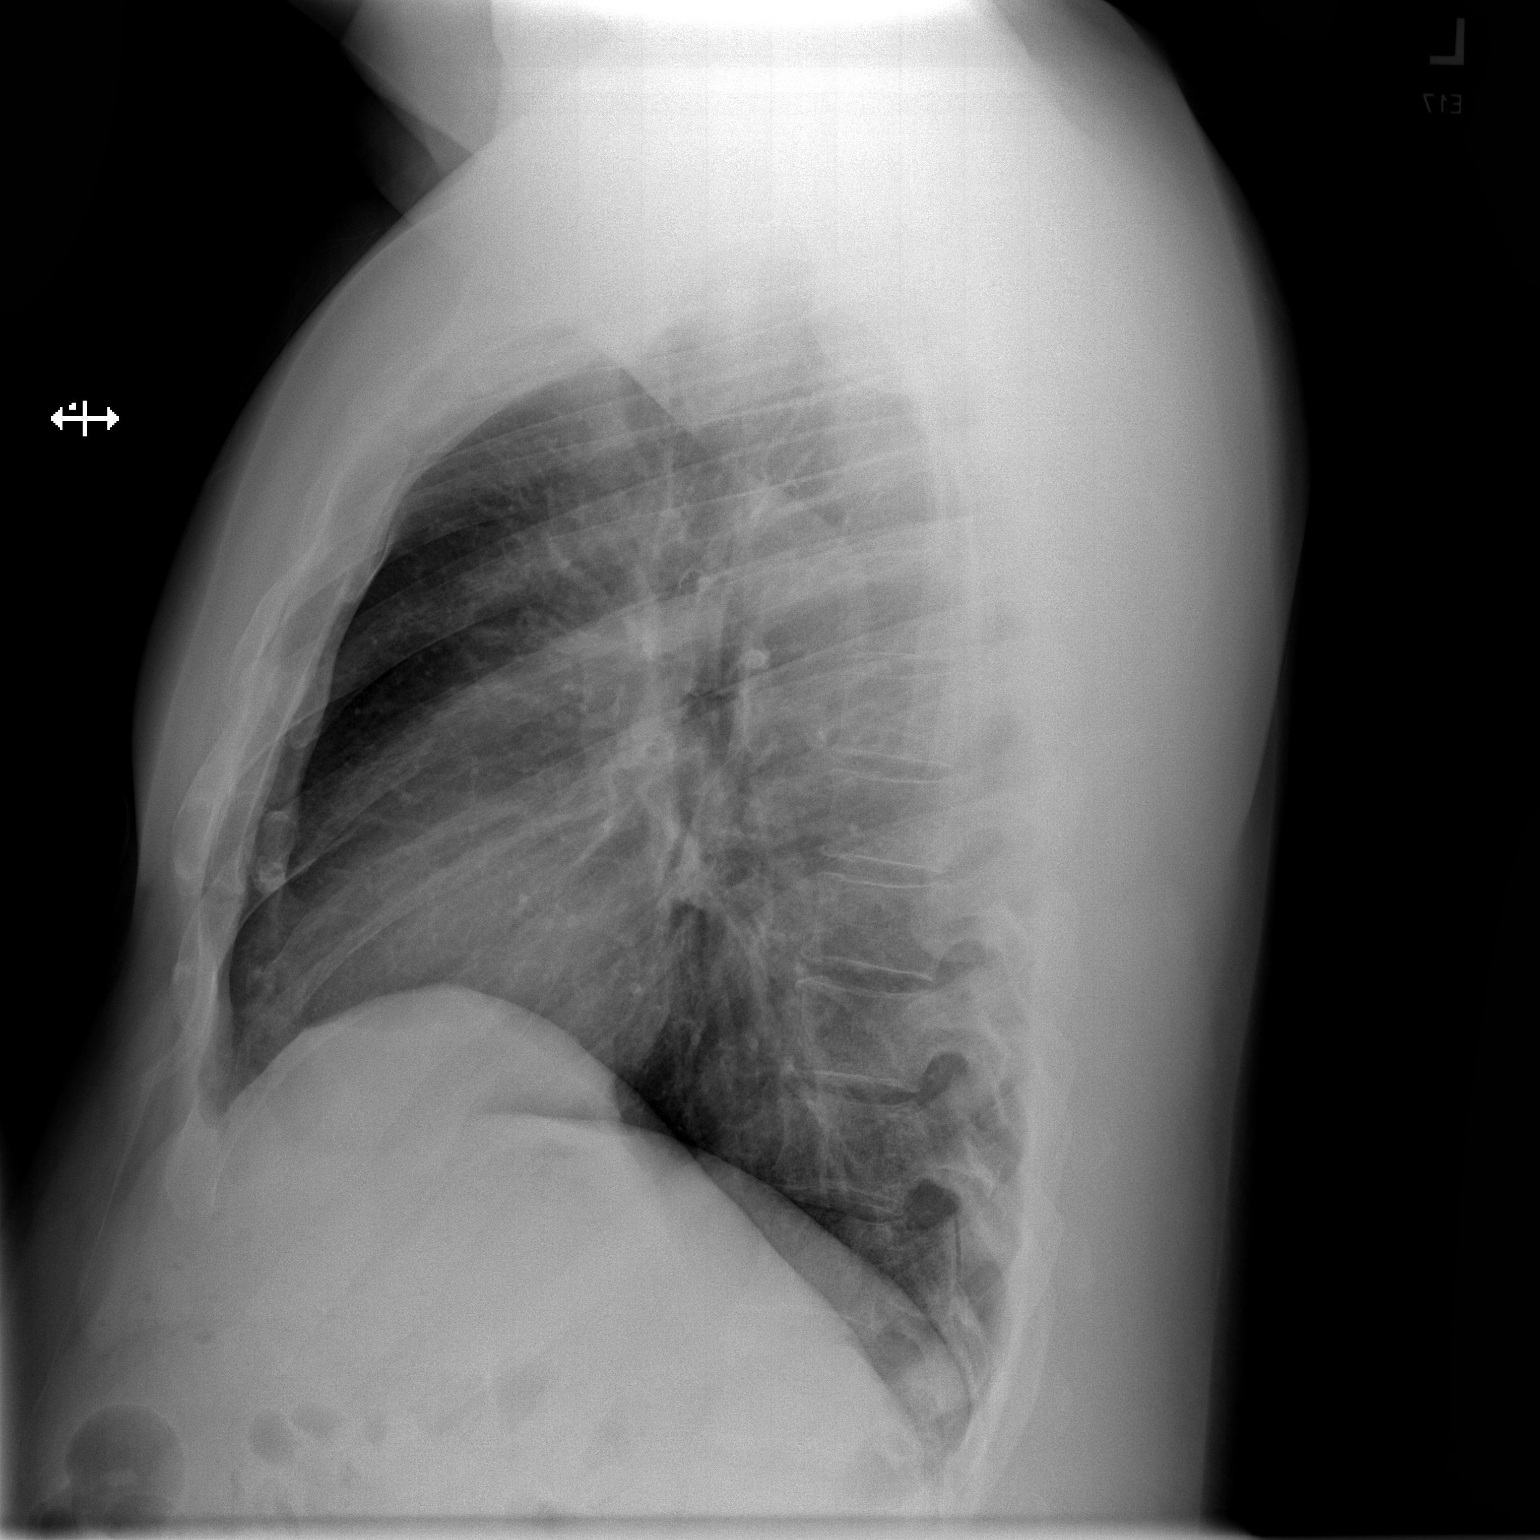

[2 of 2 positions shown; findings below may reference images not displayed]

FINDINGS: The heart size and mediastinal contours are within normal limits.
Both lungs are clear. No pleural effusion or pneumothorax. The
visualized skeletal structures are unremarkable.
IMPRESSION: Normal chest radiographs.

## 2022-12-18 ENCOUNTER — Emergency Department (HOSPITAL_COMMUNITY)
Admission: EM | Admit: 2022-12-18 | Discharge: 2022-12-18 | Disposition: A | Discharge status: Home / Self Care | Payer: Self-pay | Attending: Emergency Medicine | Admitting: Emergency Medicine

## 2022-12-18 DIAGNOSIS — R55 Syncope and collapse: Secondary | ICD-10-CM

## 2022-12-18 DIAGNOSIS — F17200 Nicotine dependence, unspecified, uncomplicated: Secondary | ICD-10-CM | POA: Insufficient documentation

## 2022-12-18 DIAGNOSIS — S00212A Abrasion of left eyelid and periocular area, initial encounter: Secondary | ICD-10-CM | POA: Insufficient documentation

## 2022-12-18 DIAGNOSIS — W01198A Fall on same level from slipping, tripping and stumbling with subsequent striking against other object, initial encounter: Secondary | ICD-10-CM | POA: Insufficient documentation

## 2022-12-18 DIAGNOSIS — J45909 Unspecified asthma, uncomplicated: Secondary | ICD-10-CM | POA: Insufficient documentation

## 2022-12-18 LAB — COMPREHENSIVE METABOLIC PANEL
ALT: 18 U/L (ref 0–44)
AST: 20 U/L (ref 15–41)
Albumin: 4.6 g/dL (ref 3.5–5.0)
Alkaline Phosphatase: 68 U/L (ref 38–126)
Anion gap: 11 (ref 5–15)
BUN: 15 mg/dL (ref 6–20)
CO2: 24 mmol/L (ref 22–32)
Calcium: 9.1 mg/dL (ref 8.9–10.3)
Chloride: 101 mmol/L (ref 98–111)
Creatinine, Ser: 1.86 mg/dL — ABNORMAL HIGH (ref 0.61–1.24)
GFR, Estimated: 45 mL/min — ABNORMAL LOW (ref >60)
Glucose, Bld: 120 mg/dL — ABNORMAL HIGH (ref 70–99)
Potassium: 3.8 mmol/L (ref 3.5–5.1)
Sodium: 136 mmol/L (ref 135–145)
Total Bilirubin: 0.6 mg/dL (ref 0.3–1.2)
Total Protein: 7.7 g/dL (ref 6.5–8.1)

## 2022-12-18 LAB — ETHANOL: Alcohol, Ethyl (B): <10 mg/dL (ref <10)

## 2022-12-18 LAB — CBC WITH DIFFERENTIAL/PLATELET
Abs Immature Granulocytes: 0.14 10*3/uL — ABNORMAL HIGH (ref 0.00–0.07)
Basophils Absolute: 0 10*3/uL (ref 0.0–0.1)
Basophils Relative: 0 %
Eosinophils Absolute: 0 10*3/uL (ref 0.0–0.5)
Eosinophils Relative: 0 %
HCT: 42.2 % (ref 39.0–52.0)
Hemoglobin: 14.2 g/dL (ref 13.0–17.0)
Immature Granulocytes: 1 %
Lymphocytes Relative: 16 %
Lymphs Abs: 1.9 10*3/uL (ref 0.7–4.0)
MCH: 30.7 pg (ref 26.0–34.0)
MCHC: 33.6 g/dL (ref 30.0–36.0)
MCV: 91.3 fL (ref 80.0–100.0)
Monocytes Absolute: 1 10*3/uL (ref 0.1–1.0)
Monocytes Relative: 8 %
Neutro Abs: 9.2 10*3/uL — ABNORMAL HIGH (ref 1.7–7.7)
Neutrophils Relative %: 75 %
Platelets: 220 10*3/uL (ref 150–400)
RBC: 4.62 MIL/uL (ref 4.22–5.81)
RDW: 12.2 % (ref 11.5–15.5)
WBC: 12.2 10*3/uL — ABNORMAL HIGH (ref 4.0–10.5)
nRBC: 0 % (ref 0.0–0.2)

## 2022-12-18 LAB — TROPONIN I (HIGH SENSITIVITY): Troponin I (High Sensitivity): 7 ng/L (ref <18)

## 2022-12-18 MED ORDER — SODIUM CHLORIDE 0.9 % IV BOLUS
1000.0000 mL | Freq: Once | INTRAVENOUS | Status: AC
  Administered 2022-12-18: 1000 mL via INTRAVENOUS

## 2022-12-18 NOTE — ED Triage Notes (Signed)
Pt from home , was going to get fresh air, felt like he needed air , stood up and fell, hit head on wall, reports hasn't really eaten and had 2 shot of liquor on a empty stomach

## 2022-12-18 NOTE — Discharge Instructions (Addendum)
Your labs today were overall reassuring.  Kidney numbers were a bit elevated today above your baseline.  Likely some dehydration so do recommend you push fluids and eat regularly. Follow-up with your primary care doctor. Return here for new concerns.

## 2022-12-18 NOTE — ED Provider Notes (Signed)
[place] EMERGENCY DEPARTMENT AT [facility] Provider Note   CSN: [id number] Arrival date & time: 12/18/22  0406     History  Chief Complaint  Patient presents with   Loss of Consciousness    Corey Serrano is a 42 y.o. male.  The history is provided by the patient and medical records.  Loss of Consciousness  42 year old male with history of asthma, presenting to the ED after a near syncopal episode.  Patient states he had a long day yesterday.  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  States he got in late and went to see a friend of his to catch up.  He did not eat or drink very much and had barely slept the night prior so was quite tired.  He did eat blueberry Parfait with some granola but did not have any other substantial food.  He has had 2 shots of liquor on an empty stomach while playing videogames.  He got up to go get some air and after walking a few steps he started to get lightheaded, vision was dark, and felt like he was going to pass out.  He fell into the wall and scraped the left side of his face.  He lowered himself to the ground and tried to get up but was feeling very weak.'s were able to get him up and into the car and brought him here for evaluation.  Currently is feeling better.  Orthostatics were performed and he did get lightheaded with that again.  He denies any chest pain or shortness of breath.  He is a daily smoker.  Home Medications Prior to Admission medications   Medication Sig Start Date End Date Taking? Authorizing Provider  ibuprofen (ADVIL) 800 MG tablet Take 1 tablet (800 mg total) by mouth every 8 (eight) hours as needed for moderate pain. 03/17/22   Rancour, Jeannett Senior, MD  omeprazole (PRILOSEC) 20 MG capsule Take 1 capsule (20 mg total) by mouth daily. 07/11/20   Rema Fendt, NP      Allergies    Shellfish allergy and Penicillins    Review of Systems   Review of Systems   Cardiovascular:  Positive for syncope.  Neurological:  Positive for syncope.  All other systems reviewed and are negative.   Physical Exam Updated Vital Signs BP 98/64   Pulse 97   Resp (!) 30   SpO2 100%   Physical Exam Vitals and nursing note reviewed.  Constitutional:      Appearance: He is well-developed.  HENT:     Head: Normocephalic and atraumatic.     Comments: No visible head trauma Eyes:     Conjunctiva/sclera: Conjunctivae normal.     Pupils: Pupils are equal, round, and reactive to light.     Comments: Abrasion to left upper eyelid without swelling/bruising  Cardiovascular:     Rate and Rhythm: Normal rate and regular rhythm.     Heart sounds: Normal heart sounds.  Pulmonary:     Effort: Pulmonary effort is normal.     Breath sounds: Normal breath sounds.  Abdominal:     General: Bowel sounds are normal.     Palpations: Abdomen is soft.  Musculoskeletal:        General: Normal range of motion.     Cervical back: Normal range of motion.  Skin:    General: Skin is warm and dry.  Neurological:     Mental  Status: He is alert and oriented to person, place, and time.     Comments: AAOx3, answering questions and following commands appropriately; equal strength UE and LE bilaterally; CN grossly intact; moves all extremities appropriately without ataxia; no focal neuro deficits or facial asymmetry appreciated     ED Results / Procedures / Treatments   Labs (all labs ordered are listed, but only abnormal results are displayed) Labs Reviewed  CBC WITH DIFFERENTIAL/PLATELET - Abnormal; Notable for the following components:      Result Value   WBC 12.2 (*)    Neutro Abs 9.2 (*)    Abs Immature Granulocytes 0.14 (*)    All other components within normal limits  COMPREHENSIVE METABOLIC PANEL - Abnormal; Notable for the following components:   Glucose, Bld 120 (*)    Creatinine, Ser 1.86 (*)    GFR, Estimated 45 (*)    All other components within normal limits   ETHANOL  TROPONIN I (HIGH SENSITIVITY)    EKG None  Radiology No results found.  Procedures Procedures    ED ECG REPORT   Date: 12/18/2022  Rate: 92  Rhythm: normal sinus rhythm  QRS Axis: normal  Intervals: normal  ST/T Wave abnormalities: normal  Conduction Disutrbances:none  Narrative Interpretation:   Old EKG Reviewed: unchanged  I have personally reviewed the EKG tracing and agree with the computerized printout as noted.   Medications Ordered in ED Medications  sodium chloride 0.9 % bolus 1,000 mL (1,000 mLs Intravenous New Bag/Given 12/18/22 0502)    ED Course/ Medical Decision Making/ A&P                                 Medical Decision Making Amount and/or Complexity of Data Reviewed Labs: ordered. ECG/medicine tests: ordered and independent interpretation performed.   42 year old male presenting to the ED after near syncopal event at home.  Worked a long day, did not really eat and drink and has had minimal sleep.  Also had some shots of liquor on empty stomach.  He is afebrile and nontoxic in appearance.  Fully awake, alert, no focal deficits.  Has a minimal abrasion to left upper eyelid without swelling.  No other findings of head/facial trauma.  Suspect likely combination of poor oral intake and lack of sleep.  He is orthostatic with standing here.  Will check labs, give IV fluids.  Will reassess.  Labs as above-- WBC count 12.3K.  SrCr 1.84, last on record 1.37 in 2022.  BUN is normal.  May be progression of CKD vs some dehydration.  Troponin is normal.  EKG NSR without acute ischemic changes.  He is feeling better here after some IVF.  Remains without chest pain or SOB.  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  I feel his near syncope is better explained by poor oral intake and lack of sleep.  Feel he is stable for discharge. Encouraged to eat/drink regularly.  Will need follow-up with PCP for re-check of  SrCr.  Can return here for new concerns.  Final Clinical Impression(s) / ED Diagnoses Final diagnoses:  Near syncope    Rx / DC Orders ED Discharge Orders     None         Garlon Hatchet, PA-C 12/18/22 1610    Zadie Rhine, MD 12/18/22 0630

## 2022-12-20 LAB — CBG MONITORING, ED: Glucose-Capillary: 127 mg/dL — ABNORMAL HIGH (ref 70–99)
